# Patient Record
Sex: Female | Born: 1985
Health system: Southern US, Community
[De-identification: ages and names within clinical notes are randomized; demographics above are authoritative.]

## PROBLEM LIST (undated history)

## (undated) ENCOUNTER — Inpatient Hospital Stay (HOSPITAL_COMMUNITY): Payer: Self-pay

## (undated) DIAGNOSIS — Z332 Encounter for elective termination of pregnancy: Secondary | ICD-10-CM

## (undated) DIAGNOSIS — F419 Anxiety disorder, unspecified: Secondary | ICD-10-CM

## (undated) DIAGNOSIS — R011 Cardiac murmur, unspecified: Secondary | ICD-10-CM

## (undated) HISTORY — PX: WISDOM TOOTH EXTRACTION: SHX21

---

## 2009-07-01 ENCOUNTER — Other Ambulatory Visit: Admission: RE | Admit: 2009-07-01 | Discharge: 2009-07-01 | Payer: Self-pay | Admitting: Obstetrics and Gynecology

## 2009-10-24 ENCOUNTER — Inpatient Hospital Stay (HOSPITAL_COMMUNITY): Admission: AD | Admit: 2009-10-24 | Discharge: 2009-10-27 | Payer: Self-pay | Admitting: Obstetrics and Gynecology

## 2009-10-25 ENCOUNTER — Encounter (INDEPENDENT_AMBULATORY_CARE_PROVIDER_SITE_OTHER): Payer: Self-pay | Admitting: Obstetrics and Gynecology

## 2009-12-12 ENCOUNTER — Emergency Department (HOSPITAL_COMMUNITY): Admission: EM | Admit: 2009-12-12 | Discharge: 2009-12-12 | Payer: Self-pay | Admitting: Emergency Medicine

## 2010-12-18 LAB — CBC
HCT: 29.6 % — ABNORMAL LOW (ref 36.0–46.0)
HCT: 32.8 % — ABNORMAL LOW (ref 36.0–46.0)
HCT: 34 % — ABNORMAL LOW (ref 36.0–46.0)
Hemoglobin: 9.8 g/dL — ABNORMAL LOW (ref 12.0–15.0)
MCHC: 33.6 g/dL (ref 30.0–36.0)
MCHC: 33.8 g/dL (ref 30.0–36.0)
MCV: 103 fL — ABNORMAL HIGH (ref 78.0–100.0)
MCV: 103.4 fL — ABNORMAL HIGH (ref 78.0–100.0)
Platelets: 283 10*3/uL (ref 150–400)
RBC: 2.84 MIL/uL — ABNORMAL LOW (ref 3.87–5.11)
RBC: 3.29 MIL/uL — ABNORMAL LOW (ref 3.87–5.11)
RDW: 12.4 % (ref 11.5–15.5)
RDW: 12.4 % (ref 11.5–15.5)
WBC: 14.6 10*3/uL — ABNORMAL HIGH (ref 4.0–10.5)

## 2010-12-18 LAB — URINALYSIS, DIPSTICK ONLY
Bilirubin Urine: NEGATIVE
Hgb urine dipstick: NEGATIVE
Specific Gravity, Urine: 1.025 (ref 1.005–1.030)
Urobilinogen, UA: 0.2 mg/dL (ref 0.0–1.0)
pH: 8 (ref 5.0–8.0)

## 2010-12-18 LAB — BASIC METABOLIC PANEL
CO2: 22 mEq/L (ref 19–32)
Calcium: 8.4 mg/dL (ref 8.4–10.5)
GFR calc Af Amer: >60 mL/min (ref >60)
GFR calc non Af Amer: >60 mL/min (ref >60)
Glucose, Bld: 167 mg/dL — ABNORMAL HIGH (ref 70–99)
Potassium: 4.1 mEq/L (ref 3.5–5.1)
Sodium: 134 mEq/L — ABNORMAL LOW (ref 135–145)

## 2010-12-18 LAB — COMPREHENSIVE METABOLIC PANEL
AST: 23 U/L (ref 0–37)
Albumin: 3 g/dL — ABNORMAL LOW (ref 3.5–5.2)
BUN: 6 mg/dL (ref 6–23)
Calcium: 8.3 mg/dL — ABNORMAL LOW (ref 8.4–10.5)
Creatinine, Ser: 0.68 mg/dL (ref 0.4–1.2)
GFR calc Af Amer: >60 mL/min (ref >60)
Total Protein: 6.7 g/dL (ref 6.0–8.3)

## 2010-12-18 LAB — DIFFERENTIAL
Basophils Relative: 1 % (ref 0–1)
Eosinophils Absolute: 0 10*3/uL (ref 0.0–0.7)
Eosinophils Relative: 0 % (ref 0–5)
Monocytes Absolute: 0.6 10*3/uL (ref 0.1–1.0)
Monocytes Relative: 6 % (ref 3–12)
Neutrophils Relative %: 78 % — ABNORMAL HIGH (ref 43–77)

## 2010-12-18 LAB — URIC ACID: Uric Acid, Serum: 3.9 mg/dL (ref 2.4–7.0)

## 2010-12-18 LAB — CULTURE, BETA STREP (GROUP B ONLY)

## 2011-08-13 ENCOUNTER — Emergency Department (HOSPITAL_COMMUNITY)
Admission: EM | Admit: 2011-08-13 | Discharge: 2011-08-14 | Disposition: A | Discharge status: Home / Self Care | Payer: Medicaid Other | Attending: Emergency Medicine | Admitting: Emergency Medicine

## 2011-08-13 ENCOUNTER — Encounter: Payer: Self-pay | Admitting: Emergency Medicine

## 2011-08-13 ENCOUNTER — Other Ambulatory Visit: Payer: Self-pay

## 2011-08-13 DIAGNOSIS — R0789 Other chest pain: Secondary | ICD-10-CM | POA: Insufficient documentation

## 2011-08-13 DIAGNOSIS — R000 Tachycardia, unspecified: Secondary | ICD-10-CM | POA: Insufficient documentation

## 2011-08-13 NOTE — ED Notes (Signed)
PT. REPORTS CHEST PAIN /PALPITATIONS ONSET THIS EVENING ,  NO SOB , NO NAUSEA ,  NO COUGH.

## 2011-08-14 ENCOUNTER — Encounter (HOSPITAL_COMMUNITY): Payer: Self-pay | Admitting: Emergency Medicine

## 2011-08-14 ENCOUNTER — Emergency Department (HOSPITAL_COMMUNITY): Payer: Medicaid Other

## 2011-08-14 NOTE — ED Provider Notes (Signed)
History     CSN: [id number] Arrival date & time: 08/13/2011 11:35 PM   First MD Initiated Contact with Patient 08/14/11 0250      Chief Complaint  Patient presents with  . Chest Pain   HPI  Hx provided by the pt.  Pt presents with complaints of chest pain and fast beating heart. Symptoms began in the evening and lasted over an hour.  Pt reports similar episodes when she was 25yo and 25yo.  Pt does admit to increased stress at work and "life".  She denies feeling any anxiety at this time.  Pt also reports symptoms have completely resolved.  Pt has no recent illness.  No fever, chills, sweats.  Pt denies SOB at this time. No pleuritic pain or hemoptysis.   History reviewed. No pertinent past medical history.  History reviewed. No pertinent past surgical history.  History reviewed. No pertinent family history.  History  Substance Use Topics  . Smoking status: Never Smoker   . Smokeless tobacco: Not on file  . Alcohol Use: No    OB History    Grav Para Term Preterm Abortions TAB SAB Ect Mult Living                  Review of Systems  Constitutional: Negative for fever and chills.  HENT: Negative for congestion and rhinorrhea.   Respiratory: Negative for cough.   Cardiovascular: Positive for chest pain and palpitations.  Gastrointestinal: Negative for nausea, vomiting, diarrhea and constipation.  All other systems reviewed and are negative.    Allergies  Penicillins cross reactors  Home Medications  No current outpatient prescriptions on file.  BP 117/64  Pulse 81  Temp(Src) 98.8 F (37.1 C) (Oral)  Resp 16  SpO2 100%  Physical Exam  Nursing note and vitals reviewed. Constitutional: She is oriented to person, place, and time. She appears well-developed and well-nourished. No distress.  HENT:  Head: Normocephalic.  Cardiovascular: Normal rate and regular rhythm.  Exam reveals no gallop and no friction rub.   No murmur heard. Pulmonary/Chest: Effort normal.  She has no wheezes. She has no rales.  Abdominal: Soft. Bowel sounds are normal. She exhibits no mass. There is no tenderness. There is no rebound and no guarding.  Musculoskeletal: She exhibits no edema and no tenderness.  Neurological: She is alert and oriented to person, place, and time.  Skin: Skin is warm. No rash noted.  Psychiatric: She has a normal mood and affect.    ED Course  Procedures (including critical care time)  Labs Reviewed - No data to display Dg Chest 2 View  08/14/2011  *RADIOLOGY REPORT*  Clinical Data: Chest pain and palpitations.  CHEST - 2 VIEW  Comparison: None.  Findings: The lungs are well-aerated and clear.  There is no evidence of focal opacification, pleural effusion or pneumothorax.  The heart is normal in size; the mediastinal contour is within normal limits.  No acute osseous abnormalities are seen.  IMPRESSION: No acute cardiopulmonary process seen.  Original Report Authenticated By: Tonia Ghent, M.D.     1. Chest pain       MDM   Pt feels fine now. Pt in no acute distress.  Pt with normal respirations, O2 sats, ECG, and CXR. Will d/c home at this time.  ECG  Date: 08/14/2011  Rate: 82  Rhythm: normal sinus rhythm  QRS Axis: normal  Intervals: normal  ST/T Wave abnormalities: normal  Conduction Disutrbances:none  Narrative Interpretation:   Old EKG  Reviewed: none available          Angus Seller, [place] 08/14/11 [phone]

## 2011-08-14 NOTE — ED Provider Notes (Signed)
Medical screening examination/treatment/procedure(s) were performed by non-physician practitioner and as supervising physician I was immediately available for consultation/collaboration.   Olivia Mackie, MD 08/14/11 [phone]

## 2011-08-14 NOTE — ED Notes (Signed)
PA Peter in with pt.

## 2011-08-14 NOTE — ED Notes (Signed)
Pt to ED c/o rapid hr starting at 17:30 this pm that increased to pain.  Presently pt showing 76 nsr on monitor and she is denying any pain.  Pt she has had 2 other experiences like this, at ages 54 and 89 and was told she had anxiety.

## 2011-10-02 ENCOUNTER — Encounter (HOSPITAL_COMMUNITY): Payer: Self-pay | Admitting: *Deleted

## 2011-10-02 ENCOUNTER — Emergency Department (HOSPITAL_COMMUNITY)
Admission: EM | Admit: 2011-10-02 | Discharge: 2011-10-02 | Disposition: A | Discharge status: Home / Self Care | Payer: Medicaid Other | Attending: Emergency Medicine | Admitting: Emergency Medicine

## 2011-10-02 DIAGNOSIS — R05 Cough: Secondary | ICD-10-CM | POA: Insufficient documentation

## 2011-10-02 DIAGNOSIS — B349 Viral infection, unspecified: Secondary | ICD-10-CM

## 2011-10-02 DIAGNOSIS — R070 Pain in throat: Secondary | ICD-10-CM | POA: Insufficient documentation

## 2011-10-02 DIAGNOSIS — R6883 Chills (without fever): Secondary | ICD-10-CM | POA: Insufficient documentation

## 2011-10-02 DIAGNOSIS — B9789 Other viral agents as the cause of diseases classified elsewhere: Secondary | ICD-10-CM | POA: Insufficient documentation

## 2011-10-02 DIAGNOSIS — R5383 Other fatigue: Secondary | ICD-10-CM | POA: Insufficient documentation

## 2011-10-02 DIAGNOSIS — R220 Localized swelling, mass and lump, head: Secondary | ICD-10-CM | POA: Insufficient documentation

## 2011-10-02 DIAGNOSIS — R1310 Dysphagia, unspecified: Secondary | ICD-10-CM | POA: Insufficient documentation

## 2011-10-02 DIAGNOSIS — R5381 Other malaise: Secondary | ICD-10-CM | POA: Insufficient documentation

## 2011-10-02 DIAGNOSIS — J3489 Other specified disorders of nose and nasal sinuses: Secondary | ICD-10-CM | POA: Insufficient documentation

## 2011-10-02 DIAGNOSIS — R059 Cough, unspecified: Secondary | ICD-10-CM | POA: Insufficient documentation

## 2011-10-02 DIAGNOSIS — R6889 Other general symptoms and signs: Secondary | ICD-10-CM | POA: Insufficient documentation

## 2011-10-02 NOTE — ED Provider Notes (Signed)
History     CSN: [id number]  Arrival date & time 10/02/11  1925   First MD Initiated Contact with Patient 10/02/11 2123      Chief Complaint  Patient presents with  . Sore Throat    (Consider location/radiation/quality/duration/timing/severity/associated sxs/prior treatment) Patient is a 26 y.o. female presenting with pharyngitis. The history is provided by the patient.  Sore Throat This is a new problem. The current episode started in the past 7 days. The problem occurs constantly. The problem has been unchanged. Associated symptoms include chills, congestion, coughing, fatigue and a sore throat. Pertinent negatives include no abdominal pain, change in bowel habit, chest pain, fever, headaches, myalgias, nausea, neck pain, numbness, rash, urinary symptoms, vertigo, visual change, vomiting or weakness. The symptoms are aggravated by nothing. She has tried nothing for the symptoms.  Sore Throat This is a new problem. The current episode started in the past 7 days. The problem occurs constantly. The problem has been unchanged. Pertinent negatives include no chest pain, no abdominal pain, no headaches and no shortness of breath. The symptoms are aggravated by nothing. She has tried nothing for the symptoms.    History reviewed. No pertinent past medical history.  History reviewed. No pertinent past surgical history.  History reviewed. No pertinent family history.  History  Substance Use Topics  . Smoking status: Never Smoker   . Smokeless tobacco: Not on file  . Alcohol Use: No     Review of Systems  Constitutional: Positive for chills and fatigue. Negative for fever.  HENT: Positive for ear pain, congestion, sore throat, rhinorrhea, sneezing and trouble swallowing. Negative for nosebleeds, drooling, neck pain, neck stiffness and tinnitus.   Eyes: Negative for pain and visual disturbance.  Respiratory: Positive for cough. Negative for chest tightness and shortness of breath.     Cardiovascular: Negative for chest pain and leg swelling.  Gastrointestinal: Negative for nausea, vomiting, abdominal pain and change in bowel habit.  Genitourinary: Negative for dysuria, hematuria and flank pain.  Musculoskeletal: Negative for myalgias, back pain and gait problem.  Skin: Negative for rash and wound.  Neurological: Negative for vertigo, syncope, weakness, numbness and headaches.  Psychiatric/Behavioral: Negative for confusion.    Allergies  Penicillins cross reactors  Home Medications  No current outpatient prescriptions on file.  BP 108/67  Pulse 108  Temp(Src) 98.7 F (37.1 C) (Oral)  Resp 20  SpO2 100%  LMP 10/02/2011  Physical Exam  Nursing note and vitals reviewed. Constitutional: She is oriented to person, place, and time. She appears well-developed and well-nourished. No distress.  HENT:  Head: Normocephalic and atraumatic.  Right Ear: Hearing, tympanic membrane, external ear and ear canal normal.  Left Ear: Hearing, tympanic membrane, external ear and ear canal normal.  Nose: Rhinorrhea present. Right sinus exhibits no maxillary sinus tenderness. Left sinus exhibits no maxillary sinus tenderness.  Mouth/Throat: Uvula is midline and mucous membranes are normal. No uvula swelling. Posterior oropharyngeal edema and posterior oropharyngeal erythema present. No oropharyngeal exudate or tonsillar abscesses.  Eyes: Conjunctivae and EOM are normal. Pupils are equal, round, and reactive to light.  Neck: Normal range of motion. Neck supple.  Cardiovascular: Normal rate, regular rhythm, normal heart sounds and intact distal pulses.   No murmur heard.      Palpated HR on my exam is 96bpm  Pulmonary/Chest: Effort normal and breath sounds normal. No respiratory distress. She has no wheezes. She exhibits no tenderness.  Abdominal: Soft. Bowel sounds are normal. She exhibits no distension. There  is no tenderness.  Musculoskeletal: She exhibits no edema and no  tenderness.  Lymphadenopathy:    She has no cervical adenopathy.  Neurological: She is alert and oriented to person, place, and time. No cranial nerve deficit.  Skin: Skin is warm and dry. No rash noted.    ED Course  Procedures (including critical care time)   Labs Reviewed  RAPID STREP SCREEN   No results found.   Dx 1: Viral Illness    MDM  Viral illness symptoms x3 days. Given there is no fever no myalgias, do not suspect influenza. The patient had a negative strep screen. She also has negative centor criteria-positive cough, negative exudate, negative cervical lymphadenopathy, negative fever. I discussed treatment for viruses with her and the symptomatic treatment options that are recommended   Medical screening examination/treatment/procedure(s) were performed by non-physician practitioner and as supervising physician I was immediately available for consultation/collaboration. Osvaldo Human, M.D.      [address] [place], [place] 10/02/11 2224  Carleene Cooper III, MD 10/03/11 [phone]

## 2011-10-02 NOTE — ED Notes (Signed)
Pt not in room at time of d/c. Presumed that pt has left dept.

## 2011-10-02 NOTE — ED Notes (Signed)
States sore throat with chest congestion and pain x 3 days. Pt ambulatory in triage speaking in complete sentences. Appears in no resp distress, cough not audible.

## 2011-10-02 NOTE — ED Notes (Signed)
sorethroat for 3 days difficulty swallowing.  Maybe some temp

## 2012-04-01 ENCOUNTER — Encounter (HOSPITAL_COMMUNITY): Payer: Self-pay | Admitting: Emergency Medicine

## 2012-04-01 ENCOUNTER — Emergency Department (HOSPITAL_COMMUNITY)
Admission: EM | Admit: 2012-04-01 | Discharge: 2012-04-01 | Disposition: A | Discharge status: Home / Self Care | Payer: Medicaid Other | Attending: Emergency Medicine | Admitting: Emergency Medicine

## 2012-04-01 DIAGNOSIS — R197 Diarrhea, unspecified: Secondary | ICD-10-CM

## 2012-04-01 DIAGNOSIS — R110 Nausea: Secondary | ICD-10-CM | POA: Insufficient documentation

## 2012-04-01 LAB — URINALYSIS, ROUTINE W REFLEX MICROSCOPIC
Bilirubin Urine: NEGATIVE
Leukocytes, UA: NEGATIVE
Nitrite: NEGATIVE
Specific Gravity, Urine: 1.014 (ref 1.005–1.030)
Urobilinogen, UA: 0.2 mg/dL (ref 0.0–1.0)

## 2012-04-01 LAB — CBC WITH DIFFERENTIAL/PLATELET
Eosinophils Absolute: 0.2 10*3/uL (ref 0.0–0.7)
Eosinophils Relative: 4 % (ref 0–5)
HCT: 38.9 % (ref 36.0–46.0)
Lymphocytes Relative: 44 % (ref 12–46)
Lymphs Abs: 1.9 10*3/uL (ref 0.7–4.0)
MCH: 32.6 pg (ref 26.0–34.0)
MCV: 95.3 fL (ref 78.0–100.0)
Monocytes Absolute: 0.2 10*3/uL (ref 0.1–1.0)
Monocytes Relative: 5 % (ref 3–12)
RBC: 4.08 MIL/uL (ref 3.87–5.11)
WBC: 4.2 10*3/uL (ref 4.0–10.5)

## 2012-04-01 LAB — COMPREHENSIVE METABOLIC PANEL
ALT: 8 U/L (ref 0–35)
BUN: 15 mg/dL (ref 6–23)
CO2: 25 mEq/L (ref 19–32)
Calcium: 9.7 mg/dL (ref 8.4–10.5)
Creatinine, Ser: 0.81 mg/dL (ref 0.50–1.10)
GFR calc Af Amer: >90 mL/min (ref >90)
GFR calc non Af Amer: >90 mL/min (ref >90)
Glucose, Bld: 122 mg/dL — ABNORMAL HIGH (ref 70–99)
Total Protein: 7.4 g/dL (ref 6.0–8.3)

## 2012-04-01 MED ORDER — ONDANSETRON HCL 4 MG PO TABS: 4.0000 mg | ORAL_TABLET | Freq: Four times a day (QID) | ORAL | Status: AC

## 2012-04-01 NOTE — ED Provider Notes (Signed)
History     CSN: [id number]  Arrival date & time 04/01/12  1136   First MD Initiated Contact with Patient 04/01/12 1233      Chief Complaint  Patient presents with  . Nausea  . Diarrhea    bloody    (Consider location/radiation/quality/duration/timing/severity/associated sxs/prior treatment) HPI  Patient presented to the emergency department with onset of nausea and diarrhea on Sunday. She states that she had a couple episodes on Monday but this morning she had one episode of diarrhea and there was some mucus with a very small amount of blood mixed in with it. 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 She said that the symptoms onset after eating [place] food on Sunday evening. She denies any abdominal pain but does endorse having some mild cramping before the diarrhea starts. The reason she came to the ER is because of the flecks of blood she saw in the diarrhea this morning. She was concerned that she has never had blood in her bowel movement before. She denies having any fevers, chills or weakness. The patient is in no acute distress and her vital signs are stable.  History reviewed. No pertinent past medical history.  History reviewed. No pertinent past surgical history.  History reviewed. No pertinent family history.  History  Substance Use Topics  . Smoking status: Current Everyday Smoker  . Smokeless tobacco: Not on file  . Alcohol Use: No    OB History    Grav Para Term Preterm Abortions TAB SAB Ect Mult Living                  Review of Systems   HEENT: denies blurry vision or change in hearing PULMONARY: Denies difficulty breathing and SOB CARDIAC: denies chest pain or heart palpitations MUSCULOSKELETAL:  denies being unable to ambulate ABDOMEN AL: denies abdominal pain GU: denies loss of bowel or urinary control NEURO: denies numbness and tingling in extremities SKIN: no new rashes PSYCH: patient denies anxiety  or depression. NECK: Pt denies having neck pain     Allergies  Penicillins cross reactors  Home Medications   Current Outpatient Rx  Name Route Sig Dispense Refill  . ACETAMINOPHEN 325 MG PO TABS Oral Take 650 mg by mouth every 6 (six) hours as needed. For pain    . NAPROXEN 250 MG PO TABS Oral Take 250 mg by mouth daily as needed. For pain    . NYSTATIN-TRIAMCINOLONE 100000-0.1 UNIT/GM-% EX CREA Topical Apply 1 application topically 2 (two) times daily.    Marland Kitchen ONDANSETRON HCL 4 MG PO TABS Oral Take 1 tablet (4 mg total) by mouth every 6 (six) hours. 12 tablet 0    BP 117/76  Pulse 99  Temp 98.8 F (37.1 C) (Oral)  Resp 20  SpO2 99%  LMP 03/02/2012  Physical Exam  Nursing note and vitals reviewed. Constitutional: She appears well-developed and well-nourished. No distress.  HENT:  Head: Normocephalic and atraumatic.  Eyes: Pupils are equal, round, and reactive to light.  Neck: Normal range of motion. Neck supple.  Cardiovascular: Normal rate and regular rhythm.   Pulmonary/Chest: Effort normal.  Abdominal: Soft. She exhibits no distension. There is no tenderness. There is no rebound.  Neurological: She is alert.  Skin: Skin is warm and dry.    ED Course  Procedures (including critical care time)  Labs Reviewed  CBC WITH DIFFERENTIAL - Abnormal; Notable for the following:    RDW  11.4 (*)     All other components within normal limits  COMPREHENSIVE METABOLIC PANEL - Abnormal; Notable for the following:    Glucose, Bld 122 (*)     Total Bilirubin 0.2 (*)     All other components within normal limits  URINALYSIS, ROUTINE W REFLEX MICROSCOPIC  PREGNANCY, URINE   No results found.   1. Diarrhea       MDM  Patient exam and laboratory workup benign. Patient was mainly concerned because of some mucousy bloody stool times one episode. I'm giving her a referral to GI. I think she most likely has upset stomach due to food. Patient not having any abdominal pain.  Abdomen reevaluated prior to discharge she continues to be painless. I've given a prescription for Zofran indication develops nausea. I've recommended that she does not take an antidiarrheal medication so that if her body is trying to get rid of infection it has not hindered.  Pt has been advised of the symptoms that warrant their return to the ED. Patient has voiced understanding and has agreed to follow-up with the PCP or specialist.         Dorthula Matas, PA 04/01/12 1418

## 2012-04-01 NOTE — ED Notes (Signed)
Pt reports onset Sunday night with nausea and bloody diarrhea this morning. 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 Symptoms onset after eating chinese food. Pt denies abdominal pain.

## 2012-04-01 NOTE — ED Notes (Signed)
Pt. Reports nausea starting Sunday, denies vomitting. States that she was in contact with a child who had worms, but did not change diaper or have contact with feces. Reports 3 episodes of bloody diarrhea starting this AM. Denies N/V currently. Abdomen soft and non-tender

## 2012-04-01 NOTE — ED Notes (Signed)
Prescription given with discharge instructions.  

## 2012-04-02 NOTE — ED Provider Notes (Signed)
Medical screening examination/treatment/procedure(s) were performed by non-physician practitioner and as supervising physician I was immediately available for consultation/collaboration.  Cyndra Numbers, MD 04/02/12 1324

## 2013-06-09 ENCOUNTER — Other Ambulatory Visit (HOSPITAL_COMMUNITY)
Admission: RE | Admit: 2013-06-09 | Discharge: 2013-06-09 | Disposition: A | Discharge status: Home / Self Care | Payer: 59 | Source: Ambulatory Visit | Attending: Family Medicine | Admitting: Family Medicine

## 2013-06-09 ENCOUNTER — Other Ambulatory Visit: Payer: Self-pay | Admitting: Family Medicine

## 2013-06-09 DIAGNOSIS — Z124 Encounter for screening for malignant neoplasm of cervix: Secondary | ICD-10-CM | POA: Insufficient documentation

## 2013-06-09 DIAGNOSIS — Z113 Encounter for screening for infections with a predominantly sexual mode of transmission: Secondary | ICD-10-CM | POA: Insufficient documentation

## 2013-06-09 DIAGNOSIS — Z1151 Encounter for screening for human papillomavirus (HPV): Secondary | ICD-10-CM | POA: Insufficient documentation

## 2013-06-09 DIAGNOSIS — R87810 Cervical high risk human papillomavirus (HPV) DNA test positive: Secondary | ICD-10-CM | POA: Insufficient documentation

## 2013-08-02 ENCOUNTER — Emergency Department (HOSPITAL_COMMUNITY)
Admission: EM | Admit: 2013-08-02 | Discharge: 2013-08-02 | Disposition: A | Discharge status: Home / Self Care | Payer: 59 | Source: Home / Self Care

## 2013-09-07 ENCOUNTER — Other Ambulatory Visit: Payer: Self-pay | Admitting: Family Medicine

## 2013-09-07 ENCOUNTER — Ambulatory Visit
Admission: RE | Admit: 2013-09-07 | Discharge: 2013-09-07 | Disposition: A | Discharge status: Home / Self Care | Payer: 59 | Source: Ambulatory Visit | Attending: Family Medicine | Admitting: Family Medicine

## 2013-09-07 DIAGNOSIS — R200 Anesthesia of skin: Secondary | ICD-10-CM

## 2013-11-04 ENCOUNTER — Other Ambulatory Visit: Payer: Self-pay | Admitting: Obstetrics and Gynecology

## 2015-12-12 ENCOUNTER — Other Ambulatory Visit: Payer: Self-pay | Admitting: Family Medicine

## 2015-12-12 ENCOUNTER — Other Ambulatory Visit (HOSPITAL_COMMUNITY)
Admission: RE | Admit: 2015-12-12 | Discharge: 2015-12-12 | Disposition: A | Discharge status: Home / Self Care | Payer: BLUE CROSS/BLUE SHIELD | Source: Ambulatory Visit | Attending: Family Medicine | Admitting: Family Medicine

## 2015-12-12 DIAGNOSIS — Z01411 Encounter for gynecological examination (general) (routine) with abnormal findings: Secondary | ICD-10-CM | POA: Diagnosis present

## 2015-12-12 DIAGNOSIS — Z1151 Encounter for screening for human papillomavirus (HPV): Secondary | ICD-10-CM | POA: Insufficient documentation

## 2015-12-15 LAB — CYTOLOGY - PAP

## 2016-11-29 DIAGNOSIS — A5901 Trichomonal vulvovaginitis: Secondary | ICD-10-CM | POA: Diagnosis not present

## 2016-11-29 DIAGNOSIS — F17210 Nicotine dependence, cigarettes, uncomplicated: Secondary | ICD-10-CM | POA: Diagnosis not present

## 2016-11-29 DIAGNOSIS — R87610 Atypical squamous cells of undetermined significance on cytologic smear of cervix (ASC-US): Secondary | ICD-10-CM | POA: Diagnosis not present

## 2016-11-29 DIAGNOSIS — B373 Candidiasis of vulva and vagina: Secondary | ICD-10-CM | POA: Diagnosis not present

## 2016-11-29 DIAGNOSIS — Z3481 Encounter for supervision of other normal pregnancy, first trimester: Secondary | ICD-10-CM | POA: Diagnosis not present

## 2016-11-29 DIAGNOSIS — Z8751 Personal history of pre-term labor: Secondary | ICD-10-CM | POA: Diagnosis not present

## 2016-11-29 LAB — OB RESULTS CONSOLE RUBELLA ANTIBODY, IGM: Rubella: IMMUNE

## 2016-11-29 LAB — OB RESULTS CONSOLE HGB/HCT, BLOOD
HEMATOCRIT: 37 %
HEMOGLOBIN: 12.3 g/dL

## 2016-11-29 LAB — OB RESULTS CONSOLE ABO/RH: RH Type: POSITIVE

## 2016-11-29 LAB — OB RESULTS CONSOLE RPR: RPR: NONREACTIVE

## 2016-11-29 LAB — CYSTIC FIBROSIS DIAGNOSTIC STUDY: Interpretation-CFDNA:: NEGATIVE

## 2016-11-29 LAB — GLUCOSE TOLERANCE, 1 HOUR: Glucose, 1 Hour GTT: 78

## 2016-11-29 LAB — OB RESULTS CONSOLE PLATELET COUNT: Platelets: 389 10*3/uL

## 2016-11-29 LAB — OB RESULTS CONSOLE GC/CHLAMYDIA
Chlamydia: NEGATIVE
GC PROBE AMP, GENITAL: NEGATIVE

## 2016-11-29 LAB — OB RESULTS CONSOLE HEPATITIS B SURFACE ANTIGEN: HEP B S AG: NEGATIVE

## 2016-11-29 LAB — OB RESULTS CONSOLE VARICELLA ZOSTER ANTIBODY, IGG: Varicella: IMMUNE

## 2016-11-29 LAB — OB RESULTS CONSOLE ANTIBODY SCREEN: ANTIBODY SCREEN: NEGATIVE

## 2016-11-29 LAB — CYTOLOGY - PAP: PAP SMEAR: ABNORMAL — AB

## 2016-12-03 ENCOUNTER — Other Ambulatory Visit (HOSPITAL_COMMUNITY): Payer: Self-pay | Admitting: Nurse Practitioner

## 2016-12-03 DIAGNOSIS — Z3682 Encounter for antenatal screening for nuchal translucency: Secondary | ICD-10-CM

## 2016-12-03 DIAGNOSIS — Z3A13 13 weeks gestation of pregnancy: Secondary | ICD-10-CM

## 2016-12-05 ENCOUNTER — Encounter (HOSPITAL_COMMUNITY): Payer: Self-pay | Admitting: *Deleted

## 2016-12-06 ENCOUNTER — Ambulatory Visit (HOSPITAL_COMMUNITY)
Admission: RE | Admit: 2016-12-06 | Discharge: 2016-12-06 | Disposition: A | Discharge status: Home / Self Care | Payer: BLUE CROSS/BLUE SHIELD | Source: Ambulatory Visit | Attending: Nurse Practitioner | Admitting: Nurse Practitioner

## 2016-12-06 ENCOUNTER — Encounter (HOSPITAL_COMMUNITY): Payer: Self-pay

## 2016-12-06 DIAGNOSIS — Z3682 Encounter for antenatal screening for nuchal translucency: Secondary | ICD-10-CM | POA: Diagnosis not present

## 2016-12-06 DIAGNOSIS — Z3A13 13 weeks gestation of pregnancy: Secondary | ICD-10-CM | POA: Insufficient documentation

## 2016-12-06 DIAGNOSIS — Z36 Encounter for antenatal screening for chromosomal anomalies: Secondary | ICD-10-CM | POA: Diagnosis not present

## 2016-12-06 DIAGNOSIS — O09211 Supervision of pregnancy with history of pre-term labor, first trimester: Secondary | ICD-10-CM | POA: Insufficient documentation

## 2016-12-06 HISTORY — DX: Cardiac murmur, unspecified: R01.1

## 2016-12-10 ENCOUNTER — Other Ambulatory Visit: Payer: Self-pay

## 2016-12-12 ENCOUNTER — Encounter: Payer: Self-pay | Admitting: Family Medicine

## 2016-12-12 ENCOUNTER — Ambulatory Visit (INDEPENDENT_AMBULATORY_CARE_PROVIDER_SITE_OTHER): Payer: BLUE CROSS/BLUE SHIELD | Admitting: Obstetrics & Gynecology

## 2016-12-12 ENCOUNTER — Encounter: Payer: Self-pay | Admitting: Obstetrics & Gynecology

## 2016-12-12 VITALS — BP 113/72 | HR 88 | Ht 62.0 in | Wt 124.1 lb

## 2016-12-12 DIAGNOSIS — O99340 Other mental disorders complicating pregnancy, unspecified trimester: Secondary | ICD-10-CM

## 2016-12-12 DIAGNOSIS — O99342 Other mental disorders complicating pregnancy, second trimester: Secondary | ICD-10-CM

## 2016-12-12 DIAGNOSIS — O99331 Smoking (tobacco) complicating pregnancy, first trimester: Secondary | ICD-10-CM | POA: Insufficient documentation

## 2016-12-12 DIAGNOSIS — O99332 Smoking (tobacco) complicating pregnancy, second trimester: Secondary | ICD-10-CM

## 2016-12-12 DIAGNOSIS — F419 Anxiety disorder, unspecified: Secondary | ICD-10-CM | POA: Insufficient documentation

## 2016-12-12 DIAGNOSIS — O09899 Supervision of other high risk pregnancies, unspecified trimester: Secondary | ICD-10-CM | POA: Insufficient documentation

## 2016-12-12 DIAGNOSIS — Z3689 Encounter for other specified antenatal screening: Secondary | ICD-10-CM

## 2016-12-12 DIAGNOSIS — O0992 Supervision of high risk pregnancy, unspecified, second trimester: Secondary | ICD-10-CM

## 2016-12-12 DIAGNOSIS — Z23 Encounter for immunization: Secondary | ICD-10-CM

## 2016-12-12 DIAGNOSIS — O09212 Supervision of pregnancy with history of pre-term labor, second trimester: Secondary | ICD-10-CM

## 2016-12-12 DIAGNOSIS — O09219 Supervision of pregnancy with history of pre-term labor, unspecified trimester: Principal | ICD-10-CM

## 2016-12-12 DIAGNOSIS — O0990 Supervision of high risk pregnancy, unspecified, unspecified trimester: Secondary | ICD-10-CM | POA: Insufficient documentation

## 2016-12-12 LAB — POCT URINALYSIS DIP (DEVICE)
Bilirubin Urine: NEGATIVE
GLUCOSE, UA: NEGATIVE mg/dL
Hgb urine dipstick: NEGATIVE
KETONES UR: NEGATIVE mg/dL
NITRITE: NEGATIVE
PH: 7 (ref 5.0–8.0)
PROTEIN: NEGATIVE mg/dL
Specific Gravity, Urine: 1.02 (ref 1.005–1.030)
UROBILINOGEN UA: 0.2 mg/dL (ref 0.0–1.0)

## 2016-12-12 MED ORDER — NICOTINE 7 MG/24HR TD PT24: 7.0000 mg | MEDICATED_PATCH | Freq: Every day | TRANSDERMAL | 12 refills | Status: DC

## 2016-12-12 MED ORDER — HYDROXYZINE PAMOATE 50 MG PO CAPS: 50.0000 mg | ORAL_CAPSULE | Freq: Three times a day (TID) | ORAL | 3 refills | Status: DC | PRN

## 2016-12-12 NOTE — Patient Instructions (Signed)
Thank you for enrolling in MyChart. Please follow the instructions below to securely access your online medical record. MyChart allows you to send messages to your doctor, view your test results, manage appointments, and more.   How Do I Sign Up? 1. In your Internet browser, go to [employer] and enter [email or web address]. 2. Click on the Sign Up Now link in the Sign In box. You will see the New Member Sign Up page. 3. Enter your MyChart Access Code exactly as it appears below. You will not need to use this code after you've completed the sign-up process. If you do not sign up before the expiration date, you must request a new code.  MyChart Access Code: [email or web address] Expires: 02/10/2017  8:47 AM  4. Enter your Social Security Number ([email or web address]) and Date of Birth (mm/dd/yyyy) as indicated and click Submit. You will be taken to the next sign-up page. 5. Create a MyChart ID. This will be your MyChart login ID and cannot be changed, so think of one that is secure and easy to remember. 6. Create a MyChart password. You can change your password at any time. 7. Enter your Password Reset Question and Answer. This can be used at a later time if you forget your password.  8. Enter your e-mail address. You will receive e-mail notification when new information is available in MyChart. 9. Click Sign Up. You can now view your medical record.   Additional Information Remember, MyChart is NOT to be used for urgent needs. For medical emergencies, dial 911.  Second Trimester of Pregnancy The second trimester is from week 14 through week 27 (months 4 through 6). The second trimester is often a time when you feel your best. Your body has adjusted to being pregnant, and you begin to feel better physically. Usually, morning sickness has lessened or quit completely, you may have more energy, and you may have an increase in appetite. The second trimester is also a time when the fetus is growing  rapidly. At the end of the sixth month, the fetus is about 9 inches long and weighs about 1 pounds. You will likely begin to feel the baby move (quickening) between 16 and 20 weeks of pregnancy. Body changes during your second trimester Your body continues to go through many changes during your second trimester. The changes vary from woman to woman.  Your weight will continue to increase. You will notice your lower abdomen bulging out.  You may begin to get stretch marks on your hips, abdomen, and breasts.  You may develop headaches that can be relieved by medicines. The medicines should be approved by your health care provider.  You may urinate more often because the fetus is pressing on your bladder.  You may develop or continue to have heartburn as a result of your pregnancy.  You may develop constipation because certain hormones are causing the muscles that push waste through your intestines to slow down.  You may develop hemorrhoids or swollen, bulging veins (varicose veins).  You may have back pain. This is caused by:  Weight gain.  Pregnancy hormones that are relaxing the joints in your pelvis.  A shift in weight and the muscles that support your balance.  Your breasts will continue to grow and they will continue to become tender.  Your gums may bleed and may be sensitive to brushing and flossing.  Dark spots or blotches (chloasma, mask of pregnancy) may develop on your face. This will likely fade after the baby  is born.  A dark line from your belly button to the pubic area (linea nigra) may appear. This will likely fade after the baby is born.  You may have changes in your hair. These can include thickening of your hair, rapid growth, and changes in texture. Some women also have hair loss during or after pregnancy, or hair that feels dry or thin. Your hair will most likely return to normal after your baby is born. What to expect at prenatal visits During a routine  prenatal visit:  You will be weighed to make sure you and the fetus are growing normally.  Your blood pressure will be taken.  Your abdomen will be measured to track your baby's growth.  The fetal heartbeat will be listened to.  Any test results from the previous visit will be discussed. Your health care provider may ask you:  How you are feeling.  If you are feeling the baby move.  If you have had any abnormal symptoms, such as leaking fluid, bleeding, severe headaches, or abdominal cramping.  If you are using any tobacco products, including cigarettes, chewing tobacco, and electronic cigarettes.  If you have any questions. Other tests that may be performed during your second trimester include:  Blood tests that check for:  Low iron levels (anemia).  High blood sugar that affects pregnant women (gestational diabetes) between 4024 and 28 weeks.  Rh antibodies. This is to check for a protein on red blood cells (Rh factor).  Urine tests to check for infections, diabetes, or protein in the urine.  An ultrasound to confirm the proper growth and development of the baby.  An amniocentesis to check for possible genetic problems.  Fetal screens for spina bifida and Down syndrome.  HIV (human immunodeficiency virus) testing. Routine prenatal testing includes screening for HIV, unless you choose not to have this test. Follow these instructions at home: Medicines   Follow your health care provider's instructions regarding medicine use. Specific medicines may be either safe or unsafe to take during pregnancy.  Take a prenatal vitamin that contains at least 600 micrograms (mcg) of folic acid.  If you develop constipation, try taking a stool softener if your health care provider approves. Eating and drinking   Eat a balanced diet that includes fresh fruits and vegetables, whole grains, good sources of protein such as meat, eggs, or tofu, and low-fat dairy. Your health care provider  will help you determine the amount of weight gain that is right for you.  Avoid raw meat and uncooked cheese. These carry germs that can cause birth defects in the baby.  If you have low calcium intake from food, talk to your health care provider about whether you should take a daily calcium supplement.  Limit foods that are high in fat and processed sugars, such as fried and sweet foods.  To prevent constipation:  Drink enough fluid to keep your urine clear or pale yellow.  Eat foods that are high in fiber, such as fresh fruits and vegetables, whole grains, and beans. Activity   Exercise only as directed by your health care provider. Most women can continue their usual exercise routine during pregnancy. Try to exercise for 30 minutes at least 5 days a week. Stop exercising if you experience uterine contractions.  Avoid heavy lifting, wear low heel shoes, and practice good posture.  A sexual relationship may be continued unless your health care provider directs you otherwise. Relieving pain and discomfort   Wear a good support bra  to prevent discomfort from breast tenderness.  Take warm sitz baths to soothe any pain or discomfort caused by hemorrhoids. Use hemorrhoid cream if your health care provider approves.  Rest with your legs elevated if you have leg cramps or low back pain.  If you develop varicose veins, wear support hose. Elevate your feet for 15 minutes, 3-4 times a day. Limit salt in your diet. Prenatal Care   Write down your questions. Take them to your prenatal visits.  Keep all your prenatal visits as told by your health care provider. This is important. Safety   Wear your seat belt at all times when driving.  Make a list of emergency phone numbers, including numbers for family, friends, the hospital, and police and fire departments. General instructions   Ask your health care provider for a referral to a local prenatal education class. Begin classes no later  than the beginning of month 6 of your pregnancy.  Ask for help if you have counseling or nutritional needs during pregnancy. Your health care provider can offer advice or refer you to specialists for help with various needs.  Do not use hot tubs, steam rooms, or saunas.  Do not douche or use tampons or scented sanitary pads.  Do not cross your legs for long periods of time.  Avoid cat litter boxes and soil used by cats. These carry germs that can cause birth defects in the baby and possibly loss of the fetus by miscarriage or stillbirth.  Avoid all smoking, herbs, alcohol, and unprescribed drugs. Chemicals in these products can affect the formation and growth of the baby.  Do not use any products that contain nicotine or tobacco, such as cigarettes and e-cigarettes. If you need help quitting, ask your health care provider.  Visit your dentist if you have not gone yet during your pregnancy. Use a soft toothbrush to brush your teeth and be gentle when you floss. Contact a health care provider if:  You have dizziness.  You have mild pelvic cramps, pelvic pressure, or nagging pain in the abdominal area.  You have persistent nausea, vomiting, or diarrhea.  You have a bad smelling vaginal discharge.  You have pain when you urinate. Get help right away if:  You have a fever.  You are leaking fluid from your vagina.  You have spotting or bleeding from your vagina.  You have severe abdominal cramping or pain.  You have rapid weight gain or weight loss.  You have shortness of breath with chest pain.  You notice sudden or extreme swelling of your face, hands, ankles, feet, or legs.  You have not felt your baby move in over an hour.  You have severe headaches that do not go away when you take medicine.  You have vision changes. Summary  The second trimester is from week 14 through week 27 (months 4 through 6). It is also a time when the fetus is growing rapidly.  Your body  goes through many changes during pregnancy. The changes vary from woman to woman.  Avoid all smoking, herbs, alcohol, and unprescribed drugs. These chemicals affect the formation and growth your baby.  Do not use any tobacco products, such as cigarettes, chewing tobacco, and e-cigarettes. If you need help quitting, ask your health care provider.  Contact your health care provider if you have any questions. Keep all prenatal visits as told by your health care provider. This is important. This information is not intended to replace advice given to you by  your health care provider. Make sure you discuss any questions you have with your health care provider. Document Released: 09/11/2001 Document Revised: 02/23/2016 Document Reviewed: 11/18/2012 Elsevier Interactive Patient Education  2017 [employer].

## 2016-12-12 NOTE — Progress Notes (Signed)
   PRENATAL VISIT NOTE  Subjective:  Beth Giles is a 31 y.o. [phone] at 6456w4d being seen today for ongoing prenatal care; [facility] history of PTD at 28 weeks.   She is currently monitored for the following issues for this high-risk pregnancy and has Supervision of high-risk pregnancy; History of preterm delivery, currently pregnant; Tobacco smoking affecting pregnancy in first trimester; and Anxiety during pregnancy, antepartum on her problem list.  Patient reports occasional anxiety, helped by cigarettes. Wants to quit smoking; smokes 3 cigarettes daily.  Wants medication that can help with anxiety and is safe in pregnancy..  Contractions: Not present. Vag. Bleeding: None.  Movement: Absent. Denies leaking of fluid.   The following portions of the patient's history were reviewed and updated as appropriate: allergies, current medications, past family history, past medical history, past social history, past surgical history and problem list. Problem list updated.  Objective:   Vitals:   12/12/16 0801 12/12/16 0812  BP: 113/72   Pulse: 88   Weight: 124 lb 1.6 oz (56.3 kg)   Height:  5\' 2"  (1.575 m)    Fetal Status: Fetal Heart Rate (bpm): 140   Movement: Absent     General:  Alert, oriented and cooperative. Patient is in no acute distress.  Skin: Skin is warm and dry. No rash noted.   Cardiovascular: Normal heart rate noted  Respiratory: Normal respiratory effort, no problems with respiration noted  Abdomen: Soft, gravid, appropriate for gestational age. Pain/Pressure: Absent     Pelvic:  Cervical exam deferred        Extremities: Normal range of motion.  Edema: None  Mental Status: Normal mood and affect. Normal behavior. Normal judgment and thought content.   Assessment and Plan:  Pregnancy: [id number] at 9256w4d  1. History of preterm delivery, currently pregnant Patient counseled about 17P, application done. Will start at 16 weeks. Serial cervical length  scans also ordered to start at 16 weeks.  - Flu Vaccine QUAD 36+ mos IM (Fluarix, Quad PF) - US MFM OB Transvaginal; Future  2. Anxiety during pregnancy, antepartum Will try Vistaril for now.  If this does not work, may try Buspirone. - hydrOXYzine (VISTARIL) 50 MG capsule; Take 1 capsule (50 mg total) by mouth 3 (three) times daily as needed.  Dispense: 30 capsule; Refill: 3  3. Tobacco smoking affecting pregnancy in first trimester Lowest patch ordered for patient.  Will talk to SW about smoking cessation programs.  - nicotine (NICODERM CQ - DOSED IN MG/24 HR) 7 mg/24hr patch; Place 1 patch (7 mg total) onto the skin daily.  Dispense: 28 patch; Refill: 12  4. Supervision of high risk pregnancy in second trimester Flu vaccine given, anatomy scan ordered.  Already had first trimester screen, normal NT/NB, analytes pending. AFP only next visit.  - Flu Vaccine QUAD 36+ mos IM (Fluarix, Quad PF) - US MFM OB COMP + 14 WK; Future No other complaints or concerns.  Routine obstetric precautions reviewed. Please refer to After Visit Summary for other counseling recommendations.  Return in about 2 weeks (around 12/26/2016) for 17P injection. Will need weekly 17P after.  4 weeks:  OB visit (HOB) and 17P.   Tereso NewcomerUgonna A Anyanwu, MD

## 2016-12-17 ENCOUNTER — Telehealth: Payer: Self-pay | Admitting: *Deleted

## 2016-12-17 NOTE — Telephone Encounter (Signed)
[facility] they could not read all of . Would like us to refax the Prescription form.  Form re- faxed.

## 2016-12-18 ENCOUNTER — Encounter: Payer: Self-pay | Admitting: *Deleted

## 2016-12-19 ENCOUNTER — Encounter (HOSPITAL_COMMUNITY): Payer: Self-pay

## 2016-12-19 ENCOUNTER — Ambulatory Visit (HOSPITAL_COMMUNITY)
Admission: RE | Admit: 2016-12-19 | Discharge: 2016-12-19 | Disposition: A | Discharge status: Home / Self Care | Payer: BLUE CROSS/BLUE SHIELD | Source: Ambulatory Visit | Attending: Obstetrics & Gynecology | Admitting: Obstetrics & Gynecology

## 2016-12-19 DIAGNOSIS — O09212 Supervision of pregnancy with history of pre-term labor, second trimester: Secondary | ICD-10-CM | POA: Insufficient documentation

## 2016-12-19 DIAGNOSIS — Z3A15 15 weeks gestation of pregnancy: Secondary | ICD-10-CM | POA: Insufficient documentation

## 2016-12-19 DIAGNOSIS — Z3686 Encounter for antenatal screening for cervical length: Secondary | ICD-10-CM | POA: Diagnosis not present

## 2016-12-19 DIAGNOSIS — O09899 Supervision of other high risk pregnancies, unspecified trimester: Secondary | ICD-10-CM

## 2016-12-19 DIAGNOSIS — O09219 Supervision of pregnancy with history of pre-term labor, unspecified trimester: Secondary | ICD-10-CM

## 2016-12-24 ENCOUNTER — Telehealth: Payer: Self-pay | Admitting: *Deleted

## 2016-12-24 NOTE — Telephone Encounter (Signed)
Received a voicemail from [facility] requesting a call back to confirm delivery information for emegency vial of makena.  I called [facility] and verified delivery information. We should receive the vial tomorrow.

## 2016-12-26 ENCOUNTER — Telehealth (HOSPITAL_COMMUNITY): Payer: Self-pay

## 2016-12-26 ENCOUNTER — Encounter: Payer: Self-pay | Admitting: General Practice

## 2016-12-26 ENCOUNTER — Ambulatory Visit (INDEPENDENT_AMBULATORY_CARE_PROVIDER_SITE_OTHER): Payer: BLUE CROSS/BLUE SHIELD | Admitting: Family Medicine

## 2016-12-26 ENCOUNTER — Other Ambulatory Visit: Payer: Self-pay | Admitting: Obstetrics and Gynecology

## 2016-12-26 ENCOUNTER — Ambulatory Visit: Payer: BLUE CROSS/BLUE SHIELD

## 2016-12-26 ENCOUNTER — Other Ambulatory Visit (HOSPITAL_COMMUNITY)
Admission: RE | Admit: 2016-12-26 | Discharge: 2016-12-26 | Disposition: A | Discharge status: Home / Self Care | Payer: BLUE CROSS/BLUE SHIELD | Source: Ambulatory Visit | Attending: Family Medicine | Admitting: Family Medicine

## 2016-12-26 ENCOUNTER — Other Ambulatory Visit: Payer: Self-pay | Admitting: Obstetrics & Gynecology

## 2016-12-26 ENCOUNTER — Encounter (HOSPITAL_COMMUNITY): Payer: Self-pay

## 2016-12-26 ENCOUNTER — Encounter (HOSPITAL_COMMUNITY): Payer: Self-pay | Admitting: *Deleted

## 2016-12-26 VITALS — BP 136/81 | HR 96 | Wt 128.0 lb

## 2016-12-26 DIAGNOSIS — O09212 Supervision of pregnancy with history of pre-term labor, second trimester: Secondary | ICD-10-CM | POA: Insufficient documentation

## 2016-12-26 DIAGNOSIS — O09899 Supervision of other high risk pregnancies, unspecified trimester: Secondary | ICD-10-CM

## 2016-12-26 DIAGNOSIS — O09892 Supervision of other high risk pregnancies, second trimester: Secondary | ICD-10-CM

## 2016-12-26 DIAGNOSIS — N760 Acute vaginitis: Secondary | ICD-10-CM | POA: Diagnosis not present

## 2016-12-26 DIAGNOSIS — B9689 Other specified bacterial agents as the cause of diseases classified elsewhere: Secondary | ICD-10-CM

## 2016-12-26 DIAGNOSIS — O26872 Cervical shortening, second trimester: Secondary | ICD-10-CM | POA: Diagnosis not present

## 2016-12-26 DIAGNOSIS — O23592 Infection of other part of genital tract in pregnancy, second trimester: Secondary | ICD-10-CM | POA: Diagnosis not present

## 2016-12-26 DIAGNOSIS — O0992 Supervision of high risk pregnancy, unspecified, second trimester: Secondary | ICD-10-CM

## 2016-12-26 DIAGNOSIS — O09219 Supervision of pregnancy with history of pre-term labor, unspecified trimester: Secondary | ICD-10-CM

## 2016-12-26 DIAGNOSIS — Z3A16 16 weeks gestation of pregnancy: Secondary | ICD-10-CM | POA: Diagnosis not present

## 2016-12-26 MED ORDER — METRONIDAZOLE 500 MG PO TABS: 500.0000 mg | ORAL_TABLET | Freq: Two times a day (BID) | ORAL | 0 refills | Status: DC

## 2016-12-26 MED ORDER — HYDROXYPROGESTERONE CAPROATE 250 MG/ML IM OIL
250.0000 mg | TOPICAL_OIL | INTRAMUSCULAR | Status: AC
  Administered 2016-12-26 – 2017-05-10 (×18): 250 mg via INTRAMUSCULAR

## 2016-12-26 NOTE — Progress Notes (Signed)
   PRENATAL VISIT NOTE  Subjective:  Beth Giles is a 31 y.o. [phone] at 6039w4d being seen today for ongoing prenatal care.  She is currently monitored for the following issues for this high-risk pregnancy and has Supervision of high-risk pregnancy; History of preterm delivery, currently pregnant; Tobacco smoking affecting pregnancy in first trimester; Anxiety during pregnancy, antepartum; and Short cervical length during pregnancy in second trimester on her problem list.  Patient reports no complaints.  Contractions: Not present. Vag. Bleeding: None.  Movement: Absent. Denies leaking of fluid.   The following portions of the patient's history were reviewed and updated as appropriate: allergies, current medications, past family history, past medical history, past social history, past surgical history and problem list. Problem list updated.  Objective:   Vitals:   12/26/16 0858  BP: 136/81  Pulse: 96  Weight: 128 lb (58.1 kg)    Fetal Status: Fetal Heart Rate (bpm): 140   Movement: Absent     General:  Alert, oriented and cooperative. Patient is in no acute distress.  Skin: Skin is warm and dry. No rash noted.   Cardiovascular: Normal heart rate noted  Respiratory: Normal respiratory effort, no problems with respiration noted  Abdomen: Soft, gravid, appropriate for gestational age. Pain/Pressure: Absent     Pelvic:  Cervical exam performed Dilation: Closed Effacement (%): Thick Station: Ballotable  Extremities: Normal range of motion.  Edema: None  Mental Status: Normal mood and affect. Normal behavior. Normal judgment and thought content.  U/S 3/21 Normal amniotic fluid volume EV views of cervix: shortened cervical length of 2.1 - 2.3 cms  without funneling The US findings were shared with Ms. Thamas JaegersLennon. With her  history of a 28 week delivery and a CL < 2.5 cms, she is a  candidate for a cerclage.  Assessment and Plan:  Pregnancy: [id number] at 4439w4d  1. History of preterm  delivery, currently pregnant in second trimester Begin 17 P  For cerclage based on MFM recs with short cervix. - hydroxyprogesterone caproate (MAKENA) 250 mg/mL injection 250 mg; Inject 1 mL (250 mg total) into the muscle once a week.   2. Supervision of high risk pregnancy in second trimester - AFP, Serum, Open Spina Bifida - HIV antibody   3. Short cervical length during pregnancy in second trimester For cerclage  4. Bacterial vaginosis - Cervicovaginal ancillary only - wet prep - metroNIDAZOLE (FLAGYL) 500 MG tablet; Take 1 tablet (500 mg total) by mouth 2 (two) times daily.  Dispense: 14 tablet; Refill: 0  Preterm labor symptoms and general obstetric precautions including but not limited to vaginal bleeding, contractions, leaking of fluid and fetal movement were reviewed in detail with the patient. Please refer to After Visit Summary for other counseling recommendations.    Reva Boresanya S Colletta Spillers, MD

## 2016-12-26 NOTE — Patient Instructions (Signed)
Breastfeeding Deciding to breastfeed is one of the best choices you can make for you and your baby. A change in hormones during pregnancy causes your breast tissue to grow and increases the number and size of your milk ducts. These hormones also allow proteins, sugars, and fats from your blood supply to make breast milk in your milk-producing glands. Hormones prevent breast milk from being released before your baby is born as well as prompt milk flow after birth. Once breastfeeding has begun, thoughts of your baby, as well as his or her sucking or crying, can stimulate the release of milk from your milk-producing glands. Benefits of breastfeeding For Your Baby  Your first milk (colostrum) helps your baby's digestive system function better.  There are antibodies in your milk that help your baby fight off infections.  Your baby has a lower incidence of asthma, allergies, and sudden infant death syndrome.  The nutrients in breast milk are better for your baby than infant formulas and are designed uniquely for your baby's needs.  Breast milk improves your baby's brain development.  Your baby is less likely to develop other conditions, such as childhood obesity, asthma, or type 2 diabetes mellitus.  For You  Breastfeeding helps to create a very special bond between you and your baby.  Breastfeeding is convenient. Breast milk is always available at the correct temperature and costs nothing.  Breastfeeding helps to burn calories and helps you lose the weight gained during pregnancy.  Breastfeeding makes your uterus contract to its prepregnancy size faster and slows bleeding (lochia) after you give birth.  Breastfeeding helps to lower your risk of developing type 2 diabetes mellitus, osteoporosis, and breast or ovarian cancer later in life.  Signs that your baby is hungry Early Signs of Hunger  Increased alertness or activity.  Stretching.  Movement of the head from side to  side.  Movement of the head and opening of the mouth when the corner of the mouth or cheek is stroked (rooting).  Increased sucking sounds, smacking lips, cooing, sighing, or squeaking.  Hand-to-mouth movements.  Increased sucking of fingers or hands.  Late Signs of Hunger  Fussing.  Intermittent crying.  Extreme Signs of Hunger Signs of extreme hunger will require calming and consoling before your baby will be able to breastfeed successfully. Do not wait for the following signs of extreme hunger to occur before you initiate breastfeeding:  Restlessness.  A loud, strong cry.  Screaming.  Breastfeeding basics Breastfeeding Initiation  Find a comfortable place to sit or lie down, with your neck and back well supported.  Place a pillow or rolled up blanket under your baby to bring him or her to the level of your breast (if you are seated). Nursing pillows are specially designed to help support your arms and your baby while you breastfeed.  Make sure that your baby's abdomen is facing your abdomen.  Gently massage your breast. With your fingertips, massage from your chest wall toward your nipple in a circular motion. This encourages milk flow. You may need to continue this action during the feeding if your milk flows slowly.  Support your breast with 4 fingers underneath and your thumb above your nipple. Make sure your fingers are well away from your nipple and your baby's mouth.  Stroke your baby's lips gently with your finger or nipple.  When your baby's mouth is open wide enough, quickly bring your baby to your breast, placing your entire nipple and as much of the colored area   around your nipple (areola) as possible into your baby's mouth. ? More areola should be visible above your baby's upper lip than below the lower lip. ? Your baby's tongue should be between his or her lower gum and your breast.  Ensure that your baby's mouth is correctly positioned around your nipple  (latched). Your baby's lips should create a seal on your breast and be turned out (everted).  It is common for your baby to suck about 2-3 minutes in order to start the flow of breast milk.  Latching Teaching your baby how to latch on to your breast properly is very important. An improper latch can cause nipple pain and decreased milk supply for you and poor weight gain in your baby. Also, if your baby is not latched onto your nipple properly, he or she may swallow some air during feeding. This can make your baby fussy. Burping your baby when you switch breasts during the feeding can help to get rid of the air. However, teaching your baby to latch on properly is still the best way to prevent fussiness from swallowing air while breastfeeding. Signs that your baby has successfully latched on to your nipple:  Silent tugging or silent sucking, without causing you pain.  Swallowing heard between every 3-4 sucks.  Muscle movement above and in front of his or her ears while sucking.  Signs that your baby has not successfully latched on to nipple:  Sucking sounds or smacking sounds from your baby while breastfeeding.  Nipple pain.  If you think your baby has not latched on correctly, slip your finger into the corner of your baby's mouth to break the suction and place it between your baby's gums. Attempt breastfeeding initiation again. Signs of Successful Breastfeeding Signs from your baby:  A gradual decrease in the number of sucks or complete cessation of sucking.  Falling asleep.  Relaxation of his or her body.  Retention of a small amount of milk in his or her mouth.  Letting go of your breast by himself or herself.  Signs from you:  Breasts that have increased in firmness, weight, and size 1-3 hours after feeding.  Breasts that are softer immediately after breastfeeding.  Increased milk volume, as well as a change in milk consistency and color by the fifth day of  breastfeeding.  Nipples that are not sore, cracked, or bleeding.  Signs That Your Baby is Getting Enough Milk  Wetting at least 1-2 diapers during the first 24 hours after birth.  Wetting at least 5-6 diapers every 24 hours for the first week after birth. The urine should be clear or pale yellow by 5 days after birth.  Wetting 6-8 diapers every 24 hours as your baby continues to grow and develop.  At least 3 stools in a 24-hour period by age 5 days. The stool should be soft and yellow.  At least 3 stools in a 24-hour period by age 7 days. The stool should be seedy and yellow.  No loss of weight greater than 10% of birth weight during the first 3 days of age.  Average weight gain of 4-7 ounces (113-198 g) per week after age 4 days.  Consistent daily weight gain by age 5 days, without weight loss after the age of 2 weeks.  After a feeding, your baby may spit up a small amount. This is common. Breastfeeding frequency and duration Frequent feeding will help you make more milk and can prevent sore nipples and breast engorgement. Breastfeed when   you feel the need to reduce the fullness of your breasts or when your baby shows signs of hunger. This is called "breastfeeding on demand." Avoid introducing a pacifier to your baby while you are working to establish breastfeeding (the first 4-6 weeks after your baby is born). After this time you may choose to use a pacifier. Research has shown that pacifier use during the first year of a baby's life decreases the risk of sudden infant death syndrome (SIDS). Allow your baby to feed on each breast as long as he or she wants. Breastfeed until your baby is finished feeding. When your baby unlatches or falls asleep while feeding from the first breast, offer the second breast. Because newborns are often sleepy in the first few weeks of life, you may need to awaken your baby to get him or her to feed. Breastfeeding times will vary from baby to baby. However,  the following rules can serve as a guide to help you ensure that your baby is properly fed:  Newborns (babies 4 weeks of age or younger) may breastfeed every 1-3 hours.  Newborns should not go longer than 3 hours during the day or 5 hours during the night without breastfeeding.  You should breastfeed your baby a minimum of 8 times in a 24-hour period until you begin to introduce solid foods to your baby at around 6 months of age.  Breast milk pumping Pumping and storing breast milk allows you to ensure that your baby is exclusively fed your breast milk, even at times when you are unable to breastfeed. This is especially important if you are going back to work while you are still breastfeeding or when you are not able to be present during feedings. Your lactation consultant can give you guidelines on how long it is safe to store breast milk. A breast pump is a machine that allows you to pump milk from your breast into a sterile bottle. The pumped breast milk can then be stored in a refrigerator or freezer. Some breast pumps are operated by hand, while others use electricity. Ask your lactation consultant which type will work best for you. Breast pumps can be purchased, but some hospitals and breastfeeding support groups lease breast pumps on a monthly basis. A lactation consultant can teach you how to hand express breast milk, if you prefer not to use a pump. Caring for your breasts while you breastfeed Nipples can become dry, cracked, and sore while breastfeeding. The following recommendations can help keep your breasts moisturized and healthy:  Avoid using soap on your nipples.  Wear a supportive bra. Although not required, special nursing bras and tank tops are designed to allow access to your breasts for breastfeeding without taking off your entire bra or top. Avoid wearing underwire-style bras or extremely tight bras.  Air dry your nipples for 3-4minutes after each feeding.  Use only cotton  bra pads to absorb leaked breast milk. Leaking of breast milk between feedings is normal.  Use lanolin on your nipples after breastfeeding. Lanolin helps to maintain your skin's normal moisture barrier. If you use pure lanolin, you do not need to wash it off before feeding your baby again. Pure lanolin is not toxic to your baby. You may also hand express a few drops of breast milk and gently massage that milk into your nipples and allow the milk to air dry.  In the first few weeks after giving birth, some women experience extremely full breasts (engorgement). Engorgement can make your   breasts feel heavy, warm, and tender to the touch. Engorgement peaks within 3-5 days after you give birth. The following recommendations can help ease engorgement:  Completely empty your breasts while breastfeeding or pumping. You may want to start by applying warm, moist heat (in the shower or with warm water-soaked hand towels) just before feeding or pumping. This increases circulation and helps the milk flow. If your baby does not completely empty your breasts while breastfeeding, pump any extra milk after he or she is finished.  Wear a snug bra (nursing or regular) or tank top for 1-2 days to signal your body to slightly decrease milk production.  Apply ice packs to your breasts, unless this is too uncomfortable for you.  Make sure that your baby is latched on and positioned properly while breastfeeding.  If engorgement persists after 48 hours of following these recommendations, contact your health care provider or a lactation consultant. Overall health care recommendations while breastfeeding  Eat healthy foods. Alternate between meals and snacks, eating 3 of each per day. Because what you eat affects your breast milk, some of the foods may make your baby more irritable than usual. Avoid eating these foods if you are sure that they are negatively affecting your baby.  Drink milk, fruit juice, and water to  satisfy your thirst (about 10 glasses a day).  Rest often, relax, and continue to take your prenatal vitamins to prevent fatigue, stress, and anemia.  Continue breast self-awareness checks.  Avoid chewing and smoking tobacco. Chemicals from cigarettes that pass into breast milk and exposure to secondhand smoke may harm your baby.  Avoid alcohol and drug use, including marijuana. Some medicines that may be harmful to your baby can pass through breast milk. It is important to ask your health care provider before taking any medicine, including all over-the-counter and prescription medicine as well as vitamin and herbal supplements. It is possible to become pregnant while breastfeeding. If birth control is desired, ask your health care provider about options that will be safe for your baby. Contact a health care provider if:  You feel like you want to stop breastfeeding or have become frustrated with breastfeeding.  You have painful breasts or nipples.  Your nipples are cracked or bleeding.  Your breasts are red, tender, or warm.  You have a swollen area on either breast.  You have a fever or chills.  You have nausea or vomiting.  You have drainage other than breast milk from your nipples.  Your breasts do not become full before feedings by the fifth day after you give birth.  You feel sad and depressed.  Your baby is too sleepy to eat well.  Your baby is having trouble sleeping.  Your baby is wetting less than 3 diapers in a 24-hour period.  Your baby has less than 3 stools in a 24-hour period.  Your baby's skin or the white part of his or her eyes becomes yellow.  Your baby is not gaining weight by 5 days of age. Get help right away if:  Your baby is overly tired (lethargic) and does not want to wake up and feed.  Your baby develops an unexplained fever. This information is not intended to replace advice given to you by your health care provider. Make sure you discuss  any questions you have with your health care provider. Document Released: 09/17/2005 Document Revised: 02/29/2016 Document Reviewed: 03/11/2013 Elsevier Interactive Patient Education  2017 [employer]  

## 2016-12-26 NOTE — Progress Notes (Signed)
Pt states that she thinks she has a bv infection.  Called [facility] regarding patients Makena refill, we received one vial yesterday, dose given today. Per representative pt received an emergency vial. Spoke with patients case manager and she stated that they are just waiting for the pharmacy to process the patient, they will be following up on Friday with them and if they aren't ready to ship Makena then, she will be sent another emergency vial prior to her next injection on Wednesday. No further action needed on our part.

## 2016-12-26 NOTE — Telephone Encounter (Signed)
Called pt to give her surgery date and time, no answer, left voicemail with surgery date and time as well as some basic Pre-op instructions, "arrival time @0930 , NPO after midnight, no lotions or perfumes, no jewelery"  Requesting that patient call me back at the office to confirm receipt of this message.  Awaiting a call back.

## 2016-12-27 ENCOUNTER — Telehealth: Payer: Self-pay | Admitting: *Deleted

## 2016-12-27 LAB — CERVICOVAGINAL ANCILLARY ONLY
BACTERIAL VAGINITIS: POSITIVE — AB
Candida vaginitis: NEGATIVE
Trichomonas: NEGATIVE

## 2016-12-27 NOTE — Telephone Encounter (Signed)
[facility] and they need us to call them to provide insurance information.

## 2016-12-28 LAB — AFP, SERUM, OPEN SPINA BIFIDA
AFP MoM: 1.24
AFP Value: 47.8 ng/mL
GEST. AGE ON COLLECTION DATE: 16.4 wk
MATERNAL AGE AT EDD: 30.2 a
OSBR Risk 1 IN: 5748
TEST RESULTS AFP: NEGATIVE
WEIGHT: 128 [lb_av]

## 2016-12-28 LAB — HIV ANTIBODY (ROUTINE TESTING W REFLEX): HIV Screen 4th Generation wRfx: NONREACTIVE

## 2016-12-31 ENCOUNTER — Encounter (HOSPITAL_COMMUNITY): Payer: Self-pay | Admitting: *Deleted

## 2016-12-31 ENCOUNTER — Encounter (HOSPITAL_COMMUNITY)
Admission: RE | Disposition: A | Discharge status: Home / Self Care | Payer: Self-pay | Source: Ambulatory Visit | Attending: Obstetrics and Gynecology

## 2016-12-31 ENCOUNTER — Ambulatory Visit (HOSPITAL_COMMUNITY): Payer: BLUE CROSS/BLUE SHIELD | Admitting: Certified Registered Nurse Anesthetist

## 2016-12-31 ENCOUNTER — Ambulatory Visit (HOSPITAL_COMMUNITY)
Admission: RE | Admit: 2016-12-31 | Discharge: 2016-12-31 | Disposition: A | Discharge status: Home / Self Care | Payer: BLUE CROSS/BLUE SHIELD | Source: Ambulatory Visit | Attending: Obstetrics and Gynecology | Admitting: Obstetrics and Gynecology

## 2016-12-31 DIAGNOSIS — O23592 Infection of other part of genital tract in pregnancy, second trimester: Secondary | ICD-10-CM | POA: Diagnosis not present

## 2016-12-31 DIAGNOSIS — O0990 Supervision of high risk pregnancy, unspecified, unspecified trimester: Secondary | ICD-10-CM

## 2016-12-31 DIAGNOSIS — B9689 Other specified bacterial agents as the cause of diseases classified elsewhere: Secondary | ICD-10-CM | POA: Diagnosis not present

## 2016-12-31 DIAGNOSIS — Z3A17 17 weeks gestation of pregnancy: Secondary | ICD-10-CM | POA: Insufficient documentation

## 2016-12-31 DIAGNOSIS — Z87891 Personal history of nicotine dependence: Secondary | ICD-10-CM | POA: Diagnosis not present

## 2016-12-31 DIAGNOSIS — F419 Anxiety disorder, unspecified: Secondary | ICD-10-CM | POA: Diagnosis not present

## 2016-12-31 DIAGNOSIS — O26872 Cervical shortening, second trimester: Secondary | ICD-10-CM | POA: Insufficient documentation

## 2016-12-31 DIAGNOSIS — O09219 Supervision of pregnancy with history of pre-term labor, unspecified trimester: Secondary | ICD-10-CM

## 2016-12-31 DIAGNOSIS — O99342 Other mental disorders complicating pregnancy, second trimester: Secondary | ICD-10-CM | POA: Diagnosis not present

## 2016-12-31 DIAGNOSIS — O09899 Supervision of other high risk pregnancies, unspecified trimester: Secondary | ICD-10-CM

## 2016-12-31 DIAGNOSIS — O3432 Maternal care for cervical incompetence, second trimester: Secondary | ICD-10-CM | POA: Insufficient documentation

## 2016-12-31 HISTORY — DX: Encounter for elective termination of pregnancy: Z33.2

## 2016-12-31 HISTORY — PX: CERVICAL CERCLAGE: SHX1329

## 2016-12-31 HISTORY — DX: Anxiety disorder, unspecified: F41.9

## 2016-12-31 LAB — CBC
HCT: 30.6 % — ABNORMAL LOW (ref 36.0–46.0)
HEMOGLOBIN: 10.5 g/dL — AB (ref 12.0–15.0)
MCH: 34.4 pg — AB (ref 26.0–34.0)
MCHC: 34.3 g/dL (ref 30.0–36.0)
MCV: 100.3 fL — ABNORMAL HIGH (ref 78.0–100.0)
PLATELETS: 309 10*3/uL (ref 150–400)
RBC: 3.05 MIL/uL — ABNORMAL LOW (ref 3.87–5.11)
RDW: 12.2 % (ref 11.5–15.5)
WBC: 7.3 10*3/uL (ref 4.0–10.5)

## 2016-12-31 SURGERY — CERCLAGE, CERVIX, VAGINAL APPROACH
Anesthesia: Spinal | Site: Vagina

## 2016-12-31 MED ORDER — FENTANYL CITRATE (PF) 100 MCG/2ML IJ SOLN: 25.0000 ug | INTRAMUSCULAR | Status: DC | PRN

## 2016-12-31 MED ORDER — EPHEDRINE 5 MG/ML INJ
INTRAVENOUS | Status: AC
  Filled 2016-12-31: qty 10

## 2016-12-31 MED ORDER — GLYCOPYRROLATE 0.2 MG/ML IJ SOLN
INTRAMUSCULAR | Status: DC | PRN
  Administered 2016-12-31: 0.2 mg via INTRAVENOUS

## 2016-12-31 MED ORDER — LACTATED RINGERS IV SOLN
INTRAVENOUS | Status: DC
  Administered 2016-12-31: 12:00:00 via INTRAVENOUS

## 2016-12-31 MED ORDER — ONDANSETRON HCL 4 MG/2ML IJ SOLN
INTRAMUSCULAR | Status: DC | PRN
  Administered 2016-12-31: 4 mg via INTRAVENOUS

## 2016-12-31 MED ORDER — OXYCODONE HCL 5 MG/5ML PO SOLN: 5.0000 mg | Freq: Once | ORAL | Status: DC | PRN

## 2016-12-31 MED ORDER — OXYCODONE HCL 5 MG PO TABS: 5.0000 mg | ORAL_TABLET | Freq: Once | ORAL | Status: DC | PRN

## 2016-12-31 MED ORDER — BUPIVACAINE IN DEXTROSE 0.75-8.25 % IT SOLN
INTRATHECAL | Status: DC | PRN
  Administered 2016-12-31: 1.2 mL via INTRATHECAL

## 2016-12-31 MED ORDER — IBUPROFEN 600 MG PO TABS: 600.0000 mg | ORAL_TABLET | Freq: Four times a day (QID) | ORAL | 0 refills | Status: DC | PRN

## 2016-12-31 MED ORDER — PHENYLEPHRINE 40 MCG/ML (10ML) SYRINGE FOR IV PUSH (FOR BLOOD PRESSURE SUPPORT)
PREFILLED_SYRINGE | INTRAVENOUS | Status: AC
  Filled 2016-12-31: qty 10

## 2016-12-31 MED ORDER — PHENYLEPHRINE HCL 10 MG/ML IJ SOLN
INTRAMUSCULAR | Status: DC | PRN
  Administered 2016-12-31: 80 ug via INTRAVENOUS

## 2016-12-31 MED ORDER — ONDANSETRON HCL 4 MG/2ML IJ SOLN
INTRAMUSCULAR | Status: AC
  Filled 2016-12-31: qty 2

## 2016-12-31 MED ORDER — PROMETHAZINE HCL 25 MG/ML IJ SOLN: 6.2500 mg | INTRAMUSCULAR | Status: DC | PRN

## 2016-12-31 MED ORDER — KETOROLAC TROMETHAMINE 30 MG/ML IJ SOLN: 30.0000 mg | Freq: Once | INTRAMUSCULAR | Status: DC | PRN

## 2016-12-31 MED ORDER — GLYCOPYRROLATE 0.2 MG/ML IJ SOLN
INTRAMUSCULAR | Status: AC
  Filled 2016-12-31: qty 1

## 2016-12-31 MED ORDER — MEPERIDINE HCL 25 MG/ML IJ SOLN: 6.2500 mg | INTRAMUSCULAR | Status: DC | PRN

## 2016-12-31 SURGICAL SUPPLY — 16 items
CANISTER SUCT 3000ML PPV (MISCELLANEOUS) IMPLANT
CLOTH BEACON ORANGE TIMEOUT ST (SAFETY) ×2 IMPLANT
COUNTER NEEDLE 1200 MAGNETIC (NEEDLE) ×2 IMPLANT
GLOVE BIO SURGEON STRL SZ 6.5 (GLOVE) ×2 IMPLANT
GLOVE BIOGEL PI IND STRL 7.0 (GLOVE) ×2 IMPLANT
GLOVE BIOGEL PI INDICATOR 7.0 (GLOVE) ×2
GOWN STRL REUS W/TWL LRG LVL3 (GOWN DISPOSABLE) ×4 IMPLANT
NS IRRIG 1000ML POUR BTL (IV SOLUTION) ×2 IMPLANT
PACK VAGINAL MINOR WOMEN LF (CUSTOM PROCEDURE TRAY) ×2 IMPLANT
PAD OB MATERNITY 4.3X12.25 (PERSONAL CARE ITEMS) ×2 IMPLANT
PAD PREP 24X48 CUFFED NSTRL (MISCELLANEOUS) ×2 IMPLANT
SUT PROLENE 1 CT 1 30 (SUTURE) ×2 IMPLANT
TOWEL OR 17X24 6PK STRL BLUE (TOWEL DISPOSABLE) ×4 IMPLANT
TRAY FOLEY CATH SILVER 14FR (SET/KITS/TRAYS/PACK) ×2 IMPLANT
TUBING NON-CON 1/4 X 20 CONN (TUBING) IMPLANT
YANKAUER SUCT BULB TIP NO VENT (SUCTIONS) IMPLANT

## 2016-12-31 NOTE — Transfer of Care (Signed)
Immediate Anesthesia Transfer of Care Note  Patient: Beth Giles  Procedure(s) Performed: Procedure(s): CERCLAGE CERVICAL (N/A)  Patient Location: PACU  Anesthesia Type:Spinal  Level of Consciousness: awake, alert  and oriented  Airway & Oxygen Therapy: Patient Spontanous Breathing  Post-op Assessment: Report given to RN and Post -op Vital signs reviewed and stable  Post vital signs: Reviewed and stable  Last Vitals:  Vitals:   12/31/16 1115  BP: 126/70  Pulse: 75  Resp: 18  Temp: 37.1 C    Last Pain:  Vitals:   12/31/16 1115  TempSrc: Oral      Patients Stated Pain Goal: 3 (12/31/16 1115)  Complications: No apparent anesthesia complications

## 2016-12-31 NOTE — Anesthesia Procedure Notes (Signed)
Spinal  Patient location during procedure: OR Staffing Anesthesiologist: Nolon Nations Performed: anesthesiologist  Preanesthetic Checklist Completed: patient identified, site marked, surgical consent, pre-op evaluation, timeout performed, IV checked, risks and benefits discussed and monitors and equipment checked Spinal Block Patient position: sitting Prep: site prepped and draped and DuraPrep Patient monitoring: heart rate, continuous pulse ox and blood pressure Approach: midline Location: L4-5 Injection technique: single-shot Needle Needle type: Sprotte  Needle gauge: 24 G Needle length: 9 cm Additional Notes Expiration date of kit checked and confirmed. Patient tolerated procedure well, without complications.

## 2016-12-31 NOTE — Op Note (Signed)
Beth Giles   PROCEDURE DATE: 12/31/2016  PREOPERATIVE DIAGNOSIS: Intrauterine pregnancy at [redacted]w[redacted]d, history of cervical incompetence   POSTOPERATIVE DIAGNOSIS: The same PROCEDURE: Transvaginal McDonald Cervical Cerclage Placement SURGEON: Adam Phenix, MD   INDICATIONS: 31 y.o. [phone] at [redacted]w[redacted]d with history of cervical incompetence and short cervix, here for cerclage placement.   The risks of surgery were discussed in detail with the patient including but not limited to: bleeding; infection which may require antibiotic therapy; injury to cervix, vagina other surrounding organs; risk of ruptured membranes and/or preterm delivery and other postoperative or anesthesia complications.  Written informed consent was obtained.    FINDINGS:  About 2 cm palpable cervical length in the vagina, closed cervix, suture knot placed anteriorly.  ANESTHESIA:  Spinal INTRAVENOUS FLUIDS: 1000  ml ESTIMATED BLOOD LOSS: 5 ml COMPLICATIONS: None immediate  PROCEDURE IN DETAIL:  The patient received intravenous antibiotics and had sequential compression devices applied to her lower extremities while in the preoperative area.  Reassuring fetal heart rate was also obtained using a doppler. She was then taken to the operating room where spinal anesthesia was administered and was found to be adequate.  She was placed in the dorsal lithotomy, and was prepped and draped in a sterile manner. Her bladder was catheterized for an unmeasured amount of clear, yellow urine.    After an adequate timeout was performed, a vaginal speculum was then placed in the patient's vagina and a single tooth tenaculum was applied to the anterior lip of the cervix.  The anterior and posterior lips of the cervix was grasped with ring forceps. A curved needle loaded with a 1 Prolene suture was inserted at 12 o'clock, as high as possible at the junction of the rugated vaginal epithelium and the smooth cervix, at least 2 cm above the external os.   Four bites are taken circumferentially around the entire cervix in a purse-string fashion, each bite should be deep enough to extend at least midway into the cervical stroma, but not into the endocervical canal. The two ends of the suture were then tied securely anteriorly and cut, leaving the ends long enough to grasp with a clamp when it is time to remove it. There was minimal bleeding noted and the ring forceps were removed with good hemostasis noted.  All instruments were removed from the patient's vagina.  Instrument, needle and sponge counts were correct x 2. The patient tolerated the procedure well, and was taken to the recovery area awake and in stable condition. Reassuring fetal heart rate was also obtained using a doppler in the recovery area.  The patient will be discharged to home as per PACU criteria.  Routine postoperative instructions given.  She was prescribed Percocet and Colace.  She will follow up in the clinic on 01/09/2017 for postoperative evaluation and ongoing prenatal care.  Adam Phenix, MD Attending Obstetrician & Gynecologist Faculty [facility], [facility]

## 2016-12-31 NOTE — Anesthesia Preprocedure Evaluation (Signed)
Anesthesia Evaluation  Patient identified by MRN, date of birth, ID band Patient awake    Reviewed: Allergy & Precautions, NPO status , Patient's Chart, lab work & pertinent test results  Airway Mallampati: I  TM Distance: >3 FB Neck ROM: Full    Dental no notable dental hx.    Pulmonary neg pulmonary ROS, former smoker,    Pulmonary exam normal breath sounds clear to auscultation       Cardiovascular negative cardio ROS Normal cardiovascular exam Rhythm:Regular Rate:Normal     Neuro/Psych negative neurological ROS  negative psych ROS   GI/Hepatic negative GI ROS, Neg liver ROS,   Endo/Other  negative endocrine ROS  Renal/GU negative Renal ROS     Musculoskeletal negative musculoskeletal ROS (+)   Abdominal   Peds  Hematology negative hematology ROS (+)   Anesthesia Other Findings   Reproductive/Obstetrics (+) Pregnancy                             Anesthesia Physical Anesthesia Plan  ASA: II  Anesthesia Plan: Spinal   Post-op Pain Management:    Induction:   Airway Management Planned:   Additional Equipment:   Intra-op Plan:   Post-operative Plan:   Informed Consent: I have reviewed the patients History and Physical, chart, labs and discussed the procedure including the risks, benefits and alternatives for the proposed anesthesia with the patient or authorized representative who has indicated his/her understanding and acceptance.   Dental advisory given  Plan Discussed with: CRNA  Anesthesia Plan Comments:         Anesthesia Quick Evaluation

## 2016-12-31 NOTE — Discharge Instructions (Signed)
DISCHARGE INSTRUCTIONS: D&C / D&E The following instructions have been prepared to help you care for yourself upon your return home.   Personal hygiene:  Use sanitary pads for vaginal drainage, not tampons.  Shower the day after your procedure.  NO tub baths, pools or Jacuzzis for 2-3 weeks.  Wipe front to back after using the bathroom.  Activity and limitations:  Do NOT drive or operate any equipment for 24 hours. The effects of anesthesia are still present and drowsiness may result.  Do NOT rest in bed all day.  Walking is encouraged.  Walk up and down stairs slowly.  You may resume your normal activity in one to two days or as indicated by your physician.  Sexual activity: NO intercourse for at least 2 weeks after the procedure, or as indicated by your physician.  Diet: Eat a light meal as desired this evening. You may resume your usual diet tomorrow.  Return to work: You may resume your work activities in one to two days or as indicated by your doctor.  What to expect after your surgery: Expect to have vaginal bleeding/discharge for 2-3 days and spotting for up to 10 days. It is not unusual to have soreness for up to 1-2 weeks. You may have a slight burning sensation when you urinate for the first day. Mild cramps may continue for a couple of days. You may have a regular period in 2-6 weeks.  Call your doctor for any of the following:  Excessive vaginal bleeding, saturating and changing one pad every hour.  Inability to urinate 6 hours after discharge from hospital.  Pain not relieved by pain medication.  Fever of 100.4 F or greater.  Unusual vaginal discharge or odor.   Call for an appointment:    Patients signature: ______________________  Nurses signature ________________________  Support person's signature_______________________   Cervical Cerclage, Care After This sheet gives you information about how to care for yourself after your procedure. Your  health care provider may also give you more specific instructions. If you have problems or questions, contact your health care provider. What can I expect after the procedure? After your procedure, it is common to have:  Cramping in your abdomen.  Mucus discharge for several days.  Painful urination (dysuria).  Small drops of blood coming from your vagina (spotting). Follow these instructions at home:  Follow instructions from your health care provider about bed rest, if this applies. You may need to be on bed rest for up to 3 days.  Take over-the-counter and prescription medicines only as told by your health care provider.  Do not drive or use heavy machinery while taking prescription pain medicine.  Keep track of your vaginal discharge and watch for any changes. If you notice changes, tell your health care provider.  Avoid physical activities and exercise until your health care provider approves. Ask your health care provider what activities are safe for you.  Until your health care provider approves:  Do not douche.  Do not have sexual intercourse.  Keep all pre-birth (prenatal) visits and all follow-up visits as told by your health care provider. This is important. You will probably have weekly visits to have your cervix checked, and you may need an ultrasound. Contact a health care provider if:  You have abnormal or bad-smelling vaginal discharge, such as clots.  You develop a rash on your skin. This may look like redness and swelling.  You become light-headed or feel like you are going to faint.  You have abdominal pain that does not get better with medicine.  You have persistent nausea or vomiting. Get help right away if:  You have vaginal bleeding that is heavier or more frequent than spotting.  You are leaking fluid or have a gush of fluid from your vagina (your water breaks).  You have a fever or chills.  You faint.  You have uterine contractions. These  may feel like:  A back ache.  Lower abdominal pain.  Mild cramps, similar to menstrual cramps.  Tightening or pressure in your abdomen.  You think that your baby is not moving as much as usual, or you cannot feel your baby move.  You have chest pain.  You have shortness of breath. This information is not intended to replace advice given to you by your health care provider. Make sure you discuss any questions you have with your health care provider. Document Released: 07/08/2013 Document Revised: 05/16/2016 Document Reviewed: 04/20/2016 Elsevier Interactive Patient Education  2017 [employer].

## 2016-12-31 NOTE — H&P (Signed)
Beth Giles is a 31 y.o. [phone] at [redacted]w[redacted]d being seen today for cervical cerclage.  She is currently monitored for the following issues for this high-risk pregnancy and has Supervision of high-risk pregnancy; History of preterm delivery, currently pregnant; Tobacco smoking affecting pregnancy in first trimester; Anxiety during pregnancy, antepartum; and Short cervical length during pregnancy in second trimester on her problem list.  Patient reports no complaints.  Contractions: Not present. Vag. Bleeding: None.  Movement: Absent. Denies leaking of fluid.   The following portions of the patient's history were reviewed and updated as appropriate: allergies, current medications, past family history, past medical history, past social history, past surgical history and problem list. Problem list updated.  Objective:      Vitals:   12/26/16 0858  BP: 136/81  Pulse: 96  Weight: 128 lb (58.1 kg)  Blood pressure 126/70, pulse 75, temperature 98.7 F (37.1 C), temperature source Oral, resp. rate 18, height  (1.575 m), weight 58.1 kg (128 lb), last menstrual period 09/01/2016, SpO2 100 %.   Fetal Status: Fetal Heart Rate (bpm): 140   Movement: Absent     General:  Alert, oriented and cooperative. Patient is in no acute distress.  Skin: Skin is warm and dry. No rash noted.   Cardiovascular: Normal heart rate noted  Respiratory: Normal respiratory effort, no problems with respiration noted  Abdomen: Soft, gravid, appropriate for gestational age. Pain/Pressure: Absent     Pelvic:  Cervical exam performed Dilation: Closed Effacement (%): Thick Station: Ballotable  Extremities: Normal range of motion.  Edema: None  Mental Status: Normal mood and affect. Normal behavior. Normal judgment and thought content.  U/S 3/21 Normal amniotic fluid volume EV views of cervix: shortened cervical length of 2.1 - 2.3 cms without funneling The US findings were shared with Ms. Kugel. With  her history of a 28 week delivery and a CL <2.5 cms, she is a candidate for a cerclage.  Assessment and Plan:  Pregnancy: [id number] at [redacted]w[redacted]d  1. History of preterm delivery, currently pregnant in second trimester Begin 17 P  For cerclage based on MFM recs with short cervix. - hydroxyprogesterone caproate (MAKENA) 250 mg/mL injection 250 mg; Inject 1 mL (250 mg total) into the muscle once a week.   2. Supervision of high risk pregnancy in second trimester - AFP, Serum, Open Spina Bifida - HIV antibody   3. Short cervical length during pregnancy in second trimester For cerclage. Risk of anesthesia, preterm delivery, vaginal bleeding, contractions, leaking of fluid, infection, pain were discussed and her questions were answered  4. Bacterial vaginosis- recently treated - Cervicovaginal ancillary only - wet prep - metroNIDAZOLE (FLAGYL) 500 MG tablet; Take 1 tablet (500 mg total) by mouth 2 (two) times daily.  Dispense: 14 tablet; Refill: 0  Preterm labor symptoms and general obstetric precautions including but not limited to vaginal bleeding, contractions, leaking of fluid were reviewed in detail with the patient.  Adam Phenix, MD 12/31/2016 11:39 AM

## 2017-01-01 ENCOUNTER — Encounter (HOSPITAL_COMMUNITY): Payer: Self-pay | Admitting: Obstetrics & Gynecology

## 2017-01-01 NOTE — Anesthesia Postprocedure Evaluation (Signed)
Anesthesia Post Note  Patient: Beth Giles  Procedure(s) Performed: Procedure(s) (LRB): CERCLAGE CERVICAL (N/A)  Patient location during evaluation: PACU Anesthesia Type: Spinal Level of consciousness: awake and alert Pain management: pain level controlled Vital Signs Assessment: post-procedure vital signs reviewed and stable Respiratory status: spontaneous breathing and respiratory function stable Cardiovascular status: blood pressure returned to baseline and stable Postop Assessment: spinal receding Anesthetic complications: no        Last Vitals:  Vitals:   12/31/16 1430 12/31/16 1440  BP: 115/70   Pulse: 93 89  Resp: 20 (!) 21  Temp:      Last Pain:  Vitals:   12/31/16 1115  TempSrc: Oral   Pain Goal: Patients Stated Pain Goal: 3 (12/31/16 1115)               Lewie Loron

## 2017-01-01 NOTE — Telephone Encounter (Signed)
[facility] called to schedule delivery for patients makena. It will be delivered sometime tomorrow. Called patient and informed her of this info and she will come on Thursday at 745 for her Makena injection.

## 2017-01-02 ENCOUNTER — Ambulatory Visit: Payer: Self-pay

## 2017-01-03 ENCOUNTER — Ambulatory Visit: Payer: BLUE CROSS/BLUE SHIELD

## 2017-01-03 VITALS — BP 117/72

## 2017-01-03 DIAGNOSIS — O09219 Supervision of pregnancy with history of pre-term labor, unspecified trimester: Principal | ICD-10-CM

## 2017-01-03 DIAGNOSIS — O09899 Supervision of other high risk pregnancies, unspecified trimester: Secondary | ICD-10-CM

## 2017-01-03 NOTE — Progress Notes (Signed)
Patient presented to office today for her 17-p injection. Patient tolerated well and will follow up next week for her next one.

## 2017-01-08 ENCOUNTER — Other Ambulatory Visit (HOSPITAL_COMMUNITY): Payer: Self-pay | Admitting: *Deleted

## 2017-01-08 ENCOUNTER — Encounter (HOSPITAL_COMMUNITY): Payer: Self-pay

## 2017-01-08 ENCOUNTER — Other Ambulatory Visit: Payer: Self-pay | Admitting: Obstetrics & Gynecology

## 2017-01-08 ENCOUNTER — Ambulatory Visit (HOSPITAL_COMMUNITY)
Admission: RE | Admit: 2017-01-08 | Discharge: 2017-01-08 | Disposition: A | Discharge status: Home / Self Care | Payer: BLUE CROSS/BLUE SHIELD | Source: Ambulatory Visit | Attending: Obstetrics & Gynecology | Admitting: Obstetrics & Gynecology

## 2017-01-08 DIAGNOSIS — O26872 Cervical shortening, second trimester: Secondary | ICD-10-CM

## 2017-01-08 DIAGNOSIS — O0992 Supervision of high risk pregnancy, unspecified, second trimester: Secondary | ICD-10-CM | POA: Insufficient documentation

## 2017-01-08 DIAGNOSIS — O09219 Supervision of pregnancy with history of pre-term labor, unspecified trimester: Secondary | ICD-10-CM | POA: Diagnosis not present

## 2017-01-08 DIAGNOSIS — Z3A18 18 weeks gestation of pregnancy: Secondary | ICD-10-CM | POA: Insufficient documentation

## 2017-01-08 DIAGNOSIS — Z3A19 19 weeks gestation of pregnancy: Secondary | ICD-10-CM | POA: Diagnosis not present

## 2017-01-08 DIAGNOSIS — Z8751 Personal history of pre-term labor: Secondary | ICD-10-CM

## 2017-01-08 DIAGNOSIS — O09212 Supervision of pregnancy with history of pre-term labor, second trimester: Secondary | ICD-10-CM | POA: Diagnosis not present

## 2017-01-08 DIAGNOSIS — Z3689 Encounter for other specified antenatal screening: Secondary | ICD-10-CM

## 2017-01-08 DIAGNOSIS — O09899 Supervision of other high risk pregnancies, unspecified trimester: Secondary | ICD-10-CM

## 2017-01-08 DIAGNOSIS — O99331 Smoking (tobacco) complicating pregnancy, first trimester: Secondary | ICD-10-CM

## 2017-01-09 ENCOUNTER — Ambulatory Visit (INDEPENDENT_AMBULATORY_CARE_PROVIDER_SITE_OTHER): Payer: BLUE CROSS/BLUE SHIELD | Admitting: Obstetrics & Gynecology

## 2017-01-09 VITALS — BP 114/67 | HR 72 | Wt 128.6 lb

## 2017-01-09 DIAGNOSIS — O09212 Supervision of pregnancy with history of pre-term labor, second trimester: Secondary | ICD-10-CM | POA: Diagnosis not present

## 2017-01-09 DIAGNOSIS — O09899 Supervision of other high risk pregnancies, unspecified trimester: Secondary | ICD-10-CM

## 2017-01-09 DIAGNOSIS — Z0489 Encounter for examination and observation for other specified reasons: Secondary | ICD-10-CM

## 2017-01-09 DIAGNOSIS — IMO0002 Reserved for concepts with insufficient information to code with codable children: Secondary | ICD-10-CM

## 2017-01-09 DIAGNOSIS — O26872 Cervical shortening, second trimester: Secondary | ICD-10-CM

## 2017-01-09 DIAGNOSIS — Z362 Encounter for other antenatal screening follow-up: Secondary | ICD-10-CM

## 2017-01-09 DIAGNOSIS — O09219 Supervision of pregnancy with history of pre-term labor, unspecified trimester: Principal | ICD-10-CM

## 2017-01-09 DIAGNOSIS — O0992 Supervision of high risk pregnancy, unspecified, second trimester: Secondary | ICD-10-CM

## 2017-01-09 MED ORDER — PROGESTERONE MICRONIZED 200 MG PO CAPS: ORAL_CAPSULE | ORAL | 3 refills | Status: DC

## 2017-01-09 NOTE — Progress Notes (Signed)
PRENATAL VISIT NOTE  Subjective:  Beth Giles is a 31 y.o. [phone] at [redacted]w[redacted]d being seen today for ongoing prenatal care.  She is currently monitored for the following issues for this high-risk pregnancy and has Supervision of high-risk pregnancy; History of preterm delivery, currently pregnant; Tobacco smoking affecting pregnancy in first trimester; Anxiety during pregnancy, antepartum; and Short cervical length during pregnancy in second trimester on her problem list.  Patient reports no complaints.   .  .  Movement: Present. Denies leaking of fluid.   The following portions of the patient's history were reviewed and updated as appropriate: allergies, current medications, past family history, past medical history, past social history, past surgical history and problem list. Problem list updated.  Objective:   Vitals:   01/09/17 1549  BP: 114/67  Pulse: 72  Weight: 128 lb 9.6 oz (58.3 kg)    Fetal Status: Fetal Heart Rate (bpm): 130   Movement: Present     General:  Alert, oriented and cooperative. Patient is in no acute distress.  Skin: Skin is warm and dry. No rash noted.   Cardiovascular: Normal heart rate noted  Respiratory: Normal respiratory effort, no problems with respiration noted  Abdomen: Soft, gravid, appropriate for gestational age. Pain/Pressure: Absent     Pelvic:  Cervical exam deferred        Extremities: Normal range of motion.  Edema: None  Mental Status: Normal mood and affect. Normal behavior. Normal judgment and thought content.   [place] Mfm Ob Transvaginal  Result Date: 01/08/2017 ----------------------------------------------------------------------  OBSTETRICS REPORT                      (Signed Final 01/08/2017 03:45 pm) ---------------------------------------------------------------------- Patient Info  ID #:       [id number]                         D.O.B.:   March 02, 1986 (30 yrs)  Name:       Beth Giles            Visit Date:  01/08/2017 02:39 pm  ---------------------------------------------------------------------- Performed By  Performed By:     Beth Giles Phy.:   [facility] for                                                             [facility]                                                             Healthcare  Attending:  Erle Crocker MD     Address:          [facility]                                                             [address]                                                             [place], [place]                                                             [address]  Referred By:      Brand Males                Location:         [facility] NP  Ref. Address:     [address]                    [place] ---------------------------------------------------------------------- Orders   #  Description                                 Code   1  [place] MFM OB COMP + 14 WK                      [id number]   2  [place] MFM OB TRANSVAGINAL                      [id number]  ----------------------------------------------------------------------   #  Ordered By               Order #        Accession #    Episode #   1  Beth Giles           [id number]      [id number]  [id number]   2  Beth Giles           [id number]      [id number]     [id number]  ---------------------------------------------------------------------- Indications   [redacted] weeks gestation of pregnancy                Z3A.18   Poor obstetric history (prior pre-term labor)  O09.219   (28 weeks)   Encounter for antenatal screening for          Z36.0   chromosomal anomalies   Cervical shortening, second trimester s/p      O26.872   cerclage   ---------------------------------------------------------------------- OB History  Blood Type:            Height:         Weight (lb):  128      BMI:  Gravidity:    3         Term:   0        Prem:   1        SAB:   0  TOP:          1       Ectopic:  0        Living: 1 ---------------------------------------------------------------------- Fetal Evaluation  Num Of Fetuses:     1  Fetal Heart         143  Rate(bpm):  Cardiac Activity:   Observed  Presentation:       Breech  Placenta:           Posterior, above cervical os  P. Cord Insertion:  Visualized, central  Amniotic Fluid  AFI FV:      Subjectively within normal limits                              Largest Pocket(cm)                              3.6 ---------------------------------------------------------------------- Biometry  BPD:      35.5  mm     G. Age:  16w 6d          3  %    CI:        63.36   %   70 - 86                                                          FL/HC:      17.1   %   15.8 - 18  HC:      143.7  mm     G. Age:  17w 4d         10  %    HC/AC:      1.15       1.07 - 1.29  AC:      125.3  mm     G. Age:  18w 1d         38  %    FL/BPD:     69.3   %  FL:       24.6  mm     G. Age:  17w 3d         13  %  FL/AC:      19.6   %   20 - 24  HUM:      22.8  mm     G. Age:  17w 0d         11  %  Est. FW:     208  gm      0 lb 7 oz     33  % ---------------------------------------------------------------------- Gestational Age  LMP:           18w 3d       Date:   09/01/16                 EDD:   06/08/17  U/S Today:     17w 4d                                        EDD:   06/14/17  Best:          18w 3d    Det. By:   LMP  (09/01/16)          EDD:   06/08/17 ---------------------------------------------------------------------- Anatomy  Cranium:               Appears normal         LVOT:                   Appears normal  Cavum:                 Appears normal         Aortic Arch:            Not well visualized  Ventricles:            Appears normal          Ductal Arch:            Not well visualized  Choroid Plexus:        Appears normal         Diaphragm:              Appears normal  Cerebellum:            Appears normal         Stomach:                Appears normal, left                                                                        sided  Posterior Fossa:       Appears normal         Abdomen:                Appears normal  Nuchal Fold:           Appears normal         Abdominal Wall:         Appears nml (cord  insert, abd wall)  Face:                  Appears normal         Cord Vessels:           Appears normal (3                         (orbits and profile)                           vessel cord)  Lips:                  Appears normal         Kidneys:                Appear normal  Palate:                Appears normal         Bladder:                Appears normal  Thoracic:              Appears normal         Spine:                  Appears normal  Heart:                 Appears normal         Upper Extremities:      Appears normal                         (4CH, axis, and                         situs)  RVOT:                  Appears normal         Lower Extremities:      Appears normal  Other:  Fetus appears to be a female. 5th digit visualized. ---------------------------------------------------------------------- Cervix Uterus Adnexa  Cervix  Length:            2.2  cm.  Measured transvaginally. Cerclage visualized.  Uterus  No abnormality visualized.  Left Ovary  No adnexal mass visualized.  Right Ovary  No adnexal mass visualized.  Cul De Sac:   No free fluid seen.  Adnexa:       No abnormality visualized. ---------------------------------------------------------------------- Impression  Indication: 31 yr old G3P0111 at [redacted]w[redacted]d with history of  preterm delivery and short cervix s/p cerclage for fetal  anatomic survey and cervical length.  Findings:  1. Single intrauterine  pregnancy.  2. Fetal biometry is consistent with dating.  3. Posterior placenta without evidence of previa.  4. Normal amniotic fluid volume.  5. Shortened transvaginal cervical length to 2.2cm; the  cerclage is visualized. No funneling is seen.  6. The views of the ductal and aortic arches are limited.  7. The remainder of the limited anatomy survey is normal. ---------------------------------------------------------------------- Recommendations  1. Appropriate fetal growth.  2. Limited anatomy survey:  - reevaluate fetal anatomy on follow up ultrasound  3. History of preterm delivery; now with short cervix:  - s/p cerclage  - on makena  - recommend continue cervical length surveillance until 28  weeks  - if  further shortening occurs recommend add vaginal  progesterone  - recommend preterm labor precautions  4. Normal first trimester screen and MSAFP ----------------------------------------------------------------------                Beth Crocker, MD Electronically Signed Final Report   01/08/2017 03:45 pm ----------------------------------------------------------------------  [place] Mfm Ob Transvaginal  Result Date: 12/20/2016 ----------------------------------------------------------------------  OBSTETRICS REPORT                      (Signed Final 12/20/2016 08:44 am) ---------------------------------------------------------------------- Patient Info  ID #:       [id number]                         D.O.B.:   02-22-1986 (30 yrs)  Name:       Maryln Manuel                  Visit Date:  12/19/2016 02:41 pm ---------------------------------------------------------------------- Performed By  Performed By:     Earley Brooke     Secondary Phy.:   [facility]                                                             [facility]  Attending:         Particia Nearing MD       Address:          [facility]                                                             [address]                                                             [place], [place]                                                             [address]  Referred By:      Brand Males                Location:         [facility] NP  Ref. Address:     [address]                    [place] ---------------------------------------------------------------------- Orders   #  Description                                 Code   1  [place] MFM OB TRANSVAGINAL                      69629.5  ----------------------------------------------------------------------   #  Ordered By               Order #        Accession #    Episode #   1  Beth Giles           [id number]      [id number]     [id number]  ---------------------------------------------------------------------- Indications   [redacted] weeks gestation of pregnancy                Z3A.15   Poor obstetric history (prior pre-term labor)  O09.219   (28 weeks)   Encounter for cervical length                  Z36.86  ---------------------------------------------------------------------- OB History  Blood Type:            Height:         Weight (lb):  128      BMI:  Gravidity:    3         Term:   0        Prem:   1        SAB:   0  TOP:          1       Ectopic:  0        Living: 1 ---------------------------------------------------------------------- Fetal Evaluation  Num Of Fetuses:     1  Fetal Heart         138  Rate(bpm):  Cardiac Activity:   Observed  Presentation:       Cephalic  Placenta:           Posterior, above cervical os  Amniotic Fluid  AFI  FV:      Subjectively within normal limits ----------------------------------------------------------------------  Gestational Age  LMP:           15w 4d       Date:   09/01/16                 EDD:   06/08/17  Best:          15w 4d    Det. By:   LMP  (09/01/16)          EDD:   06/08/17 ---------------------------------------------------------------------- Cervix Uterus Adnexa  Cervix  Length:            2.1  cm.  Measured transvaginally. 2.1 cm with Valsalva maneuver ---------------------------------------------------------------------- Impression  SIUP at 15+4 weeks  Normal amniotic fluid volume  EV views of cervix: shortened cervical length of 2.1 - 2.3 cms  without funneling  The US findings were shared with Ms. Marland. With her  history of a 28 week delivery and a CL < 2.5 cms, she is a  candidate for a cerclage. I briefly reviewed the risks and  benefits of cerclage vs serial CLs. She was interested in  pursuing cerclage placement. Discussed case with Dr. Debroah Loop. ---------------------------------------------------------------------- Recommendations  Keep appt for anatomic survey and CL in 2 weeks ----------------------------------------------------------------------                 Particia Nearing, MD Electronically Signed Final Report   12/20/2016 08:44 am ----------------------------------------------------------------------  [place] Mfm Ob Comp + 14 Wk  Result Date: 01/08/2017 ----------------------------------------------------------------------  OBSTETRICS REPORT                      (Signed Final 01/08/2017 03:45 pm) ---------------------------------------------------------------------- Patient Info  ID #:       [id number]                         D.O.B.:   [birth year] (30 yrs)  Name:       ELLOISE ROARK [facility]            Visit Date:  01/08/2017 02:39 pm ---------------------------------------------------------------------- Performed By  Performed By:     Beth Giles Phy.:   [facility] for                                                              [facility]                                                             Healthcare  Attending:  Erle Crocker MD     Address:          [facility]                                                             [address]                                                             [place], [place]                                                             [address]  Referred By:      Brand Males                Location:         [facility] NP  Ref. Address:     [address]                    [place] ---------------------------------------------------------------------- Orders   #  Description                                 Code   1  [place] MFM OB COMP + 14 WK                      [id number]   2  [place] MFM OB TRANSVAGINAL                      [id number]  ----------------------------------------------------------------------   #  Ordered By               Order #        Accession #    Episode #   1  Beth Giles           [id number]      [id number]  [id number]   2  Beth Giles           [id number]      [id number]     [id number]  ---------------------------------------------------------------------- Indications   [redacted] weeks gestation of pregnancy                Z3A.18   Poor obstetric history (prior pre-term labor)  O09.219   (28 weeks)   Encounter for antenatal screening for          Z36.0   chromosomal anomalies   Cervical shortening, second trimester s/p      O26.872   cerclage  ---------------------------------------------------------------------- OB History  Blood Type:            Height:         Weight (lb):  128      BMI:  Gravidity:    3         Term:   0        Prem:   1        SAB:   0  TOP:          1       Ectopic:  0        Living: 1  ---------------------------------------------------------------------- Fetal Evaluation  Num Of Fetuses:     1  Fetal Heart         143  Rate(bpm):  Cardiac Activity:   Observed  Presentation:       Breech  Placenta:           Posterior, above cervical os  P. Cord Insertion:  Visualized, central  Amniotic Fluid  AFI FV:      Subjectively within normal limits                              Largest Pocket(cm)                              3.6 ---------------------------------------------------------------------- Biometry  BPD:      35.5  mm     G. Age:  16w 6d          3  %    CI:        63.36   %   70 - 86                                                          FL/HC:      17.1   %   15.8 - 18  HC:      143.7  mm     G. Age:  17w 4d         10  %    HC/AC:      1.15       1.07 - 1.29  AC:      125.3  mm     G. Age:  18w 1d         38  %    FL/BPD:     69.3   %  FL:       24.6  mm     G. Age:  17w 3d         13  %  FL/AC:      19.6   %   20 - 24  HUM:      22.8  mm     G. Age:  17w 0d         11  %  Est. FW:     208  gm      0 lb 7 oz     33  % ---------------------------------------------------------------------- Gestational Age  LMP:           18w 3d       Date:   09/01/16                 EDD:   06/08/17  U/S Today:     17w 4d                                        EDD:   06/14/17  Best:          18w 3d    Det. By:   LMP  (09/01/16)          EDD:   06/08/17 ---------------------------------------------------------------------- Anatomy  Cranium:               Appears normal         LVOT:                   Appears normal  Cavum:                 Appears normal         Aortic Arch:            Not well visualized  Ventricles:            Appears normal         Ductal Arch:            Not well visualized  Choroid Plexus:        Appears normal         Diaphragm:              Appears normal  Cerebellum:            Appears normal         Stomach:                Appears normal, left                                                                         sided  Posterior Fossa:       Appears normal         Abdomen:                Appears normal  Nuchal Fold:           Appears normal         Abdominal Wall:         Appears nml (cord  insert, abd wall)  Face:                  Appears normal         Cord Vessels:           Appears normal (3                         (orbits and profile)                           vessel cord)  Lips:                  Appears normal         Kidneys:                Appear normal  Palate:                Appears normal         Bladder:                Appears normal  Thoracic:              Appears normal         Spine:                  Appears normal  Heart:                 Appears normal         Upper Extremities:      Appears normal                         (4CH, axis, and                         situs)  RVOT:                  Appears normal         Lower Extremities:      Appears normal  Other:  Fetus appears to be a female. 5th digit visualized. ---------------------------------------------------------------------- Cervix Uterus Adnexa  Cervix  Length:            2.2  cm.  Measured transvaginally. Cerclage visualized.  Uterus  No abnormality visualized.  Left Ovary  No adnexal mass visualized.  Right Ovary  No adnexal mass visualized.  Cul De Sac:   No free fluid seen.  Adnexa:       No abnormality visualized. ---------------------------------------------------------------------- Impression  Indication: 31 yr old G3P0111 at [redacted]w[redacted]d with history of  preterm delivery and short cervix s/p cerclage for fetal  anatomic survey and cervical length.  Findings:  1. Single intrauterine pregnancy.  2. Fetal biometry is consistent with dating.  3. Posterior placenta without evidence of previa.  4. Normal amniotic fluid volume.  5. Shortened transvaginal cervical length to 2.2cm; the  cerclage is visualized. No funneling is seen.  6. The views of the ductal and  aortic arches are limited.  7. The remainder of the limited anatomy survey is normal. ---------------------------------------------------------------------- Recommendations  1. Appropriate fetal growth.  2. Limited anatomy survey:  - reevaluate fetal anatomy on follow up ultrasound  3. History of preterm delivery; now with short cervix:  - s/p cerclage  - on makena  - recommend continue cervical length surveillance until 28  weeks  - if  further shortening occurs recommend add vaginal  progesterone  - recommend preterm labor precautions  4. Normal first trimester screen and MSAFP ----------------------------------------------------------------------                Beth Crocker, MD Electronically Signed Final Report   01/08/2017 03:45 pm ----------------------------------------------------------------------   Assessment and Plan:  Pregnancy: [id number] at [redacted]w[redacted]d 1. History of preterm delivery, currently pregnant Continue 17P weekly.   2. Short cervical length during pregnancy in second trimester Given cervical length of 2.2 cm, will proactively start vaginal progesterone. Patient agrees to plan. Will follow up other cervical length scans.  Cerclage is in place.  - progesterone (PROMETRIUM) 200 MG capsule; Place one capsule vaginally at bedtime  Dispense: 30 capsule; Refill: 3  3. Evaluate anatomy not seen on prior sonogram Ductal/aortic arches not seen, follow up in one month. - [place] MFM OB FOLLOW UP; Future  4. Supervision of high risk pregnancy in second trimester Preterm labor symptoms and general obstetric precautions including but not limited to vaginal bleeding, contractions, leaking of fluid and fetal movement were reviewed in detail with the patient. Please refer to After Visit Summary for other counseling recommendations.  Return in about 1 week (around 01/16/2017) for 17P. 2 weeks 17P and OB vist (HOB).   Tereso Newcomer, MD

## 2017-01-09 NOTE — Patient Instructions (Signed)
Return to clinic for any scheduled appointments or obstetric concerns, or go to MAU for evaluation   AREA PEDIATRIC/FAMILY PRACTICE PHYSICIANS  [facility] [address], [address] [place], [place]  [address] Phone - [phone]   Fax - [phone]  [facility] [address] [place], [place] [address] Phone - [phone]   Fax - [phone]  [facility] [address] [place], [place]  [address] Phone - [phone]   Fax - [phone]  [facility] [address] [place], [place]  [address] Phone - [phone]   Fax - [phone]  [facility] [address] [place], [place]  [address] Phone - [phone]   Fax - [phone]  [facility] [address], [address] [place], [place]  [address] Phone - [phone]   Fax - [phone]  [facility] [address] [place], [place]  [address] Phone - [phone]   Fax - [phone]  [facility] [address], [address] [place], [place]  [address] Phone - [phone]   Fax - [phone]  [facility] [address] [place], [place]  [address] Phone - [phone]   Fax - [phone] [facility] [address] [place], [place]  [address] Phone - [phone]   Fax - [phone]  [facility] [address] [place], [place]  [address] Phone - [phone]   Fax - [phone]  [facility] [address] [place], [place]  [address] Phone - [phone]   Fax - [phone]  [facility] [address], [address] [place], [place]  [address] Phone - [phone]   Fax - [phone]  SHILPA GOSRANI [address] [place], [place]  [address] Phone - [phone]  [facility] [address] [place], [place]  [address] Phone - [phone]   Fax - [phone]  [facility] [address] [place], [place]  [address] Phone - [phone]   Fax - [phone]  [facility] [address] [place], [place]  [address] Phone - [phone]   Fax - [phone]  [facility] [address] [place], [place]  [address] Phone - [phone]   Fax - [phone]   [facility] [address] [place], [place]  [address] Phone - [phone]   Fax - [phone]  [facility] [address] [place], [place]  [address] Phone - [phone]   Fax - [phone]  [facility] R[facility] [address] [place], [place]  [address] Phone - [phone]   Fax - [phone]  [facility] [address] [place], [place]  [address] Phone - [phone]   Fax - [phone]  [facility] [address] , [place]  [address] Phone - [phone]   Fax - [phone] [facility] [address] [place], [place]  [address] Phone - [phone]   Fax - [phone]  [facility] [address] [place], [place]  [address] Phone - [phone]   Fax - [phone]  [facility] [address] [place], [place]  [address] Phone - [phone]   Fax - [phone]  [facility] Charlene Flemming MD [address] [place] [address] Phone [phone]    Fax [phone]   

## 2017-01-16 ENCOUNTER — Ambulatory Visit (INDEPENDENT_AMBULATORY_CARE_PROVIDER_SITE_OTHER): Payer: BLUE CROSS/BLUE SHIELD | Admitting: *Deleted

## 2017-01-16 VITALS — BP 118/63 | HR 76 | Wt 131.5 lb

## 2017-01-16 DIAGNOSIS — O09212 Supervision of pregnancy with history of pre-term labor, second trimester: Secondary | ICD-10-CM | POA: Diagnosis not present

## 2017-01-16 DIAGNOSIS — O09219 Supervision of pregnancy with history of pre-term labor, unspecified trimester: Principal | ICD-10-CM

## 2017-01-16 DIAGNOSIS — O09899 Supervision of other high risk pregnancies, unspecified trimester: Secondary | ICD-10-CM

## 2017-01-23 ENCOUNTER — Ambulatory Visit (INDEPENDENT_AMBULATORY_CARE_PROVIDER_SITE_OTHER): Payer: BLUE CROSS/BLUE SHIELD | Admitting: Obstetrics & Gynecology

## 2017-01-23 ENCOUNTER — Ambulatory Visit (HOSPITAL_COMMUNITY)
Admission: RE | Admit: 2017-01-23 | Discharge: 2017-01-23 | Disposition: A | Discharge status: Home / Self Care | Payer: BLUE CROSS/BLUE SHIELD | Source: Ambulatory Visit | Attending: Obstetrics & Gynecology | Admitting: Obstetrics & Gynecology

## 2017-01-23 ENCOUNTER — Other Ambulatory Visit (HOSPITAL_COMMUNITY)
Admission: RE | Admit: 2017-01-23 | Discharge: 2017-01-23 | Disposition: A | Discharge status: Home / Self Care | Payer: BLUE CROSS/BLUE SHIELD | Source: Ambulatory Visit | Attending: Obstetrics & Gynecology | Admitting: Obstetrics & Gynecology

## 2017-01-23 ENCOUNTER — Encounter (HOSPITAL_COMMUNITY): Payer: Self-pay

## 2017-01-23 VITALS — BP 122/65 | HR 73 | Wt 130.4 lb

## 2017-01-23 DIAGNOSIS — O26892 Other specified pregnancy related conditions, second trimester: Secondary | ICD-10-CM

## 2017-01-23 DIAGNOSIS — Z113 Encounter for screening for infections with a predominantly sexual mode of transmission: Secondary | ICD-10-CM | POA: Diagnosis not present

## 2017-01-23 DIAGNOSIS — Z8751 Personal history of pre-term labor: Secondary | ICD-10-CM

## 2017-01-23 DIAGNOSIS — N894 Leukoplakia of vagina: Secondary | ICD-10-CM | POA: Insufficient documentation

## 2017-01-23 DIAGNOSIS — N898 Other specified noninflammatory disorders of vagina: Secondary | ICD-10-CM | POA: Diagnosis not present

## 2017-01-23 DIAGNOSIS — O09212 Supervision of pregnancy with history of pre-term labor, second trimester: Secondary | ICD-10-CM

## 2017-01-23 DIAGNOSIS — Z3A20 20 weeks gestation of pregnancy: Secondary | ICD-10-CM | POA: Insufficient documentation

## 2017-01-23 DIAGNOSIS — O0992 Supervision of high risk pregnancy, unspecified, second trimester: Secondary | ICD-10-CM

## 2017-01-23 DIAGNOSIS — O26872 Cervical shortening, second trimester: Secondary | ICD-10-CM

## 2017-01-23 DIAGNOSIS — O09219 Supervision of pregnancy with history of pre-term labor, unspecified trimester: Secondary | ICD-10-CM

## 2017-01-23 DIAGNOSIS — O99342 Other mental disorders complicating pregnancy, second trimester: Secondary | ICD-10-CM

## 2017-01-23 DIAGNOSIS — O09899 Supervision of other high risk pregnancies, unspecified trimester: Secondary | ICD-10-CM

## 2017-01-23 DIAGNOSIS — F419 Anxiety disorder, unspecified: Secondary | ICD-10-CM

## 2017-01-23 DIAGNOSIS — O99340 Other mental disorders complicating pregnancy, unspecified trimester: Secondary | ICD-10-CM

## 2017-01-23 DIAGNOSIS — O3432 Maternal care for cervical incompetence, second trimester: Secondary | ICD-10-CM | POA: Diagnosis not present

## 2017-01-23 MED ORDER — BUSPIRONE HCL 10 MG PO TABS: 10.0000 mg | ORAL_TABLET | Freq: Three times a day (TID) | ORAL | 3 refills | Status: DC

## 2017-01-23 NOTE — Progress Notes (Signed)
   PRENATAL VISIT NOTE  Subjective:  Beth Giles is a 31 y.o. [phone] at [redacted]w[redacted]d being seen today for ongoing prenatal care.  She is currently monitored for the following issues for this low-risk pregnancy and has Supervision of high-risk pregnancy; History of preterm delivery, currently pregnant; Tobacco smoking affecting pregnancy in first trimester; Anxiety during pregnancy, antepartum; and Short cervical length during pregnancy in second trimester on her problem list.  Patient reports abnormal vaginal discharge with mild odor.  Also noticed red discharge after using vaginal progesterone; the medication has red capsule coverings.  Contractions: Not present. Vag. Bleeding: None.  Movement: Present. Denies leaking of fluid.   The following portions of the patient's history were reviewed and updated as appropriate: allergies, current medications, past family history, past medical history, past social history, past surgical history and problem list. Problem list updated.  Objective:   Vitals:   01/23/17 1406  BP: 122/65  Pulse: 73  Weight: 130 lb 6.4 oz (59.1 kg)    Fetal Status: Fetal Heart Rate (bpm): 141   Movement: Present     General:  Alert, oriented and cooperative. Patient is in no acute distress.  Skin: Skin is warm and dry. No rash noted.   Cardiovascular: Normal heart rate noted  Respiratory: Normal respiratory effort, no problems with respiration noted  Abdomen: Soft, gravid, appropriate for gestational age. Pain/Pressure: Absent     Pelvic:  Cervical exam performed Dilation: Closed Effacement (%): Thick Station: Ballotable. Normal appearing vaginal discharge, testing sample obtained.  Extremities: Normal range of motion.  Edema: None  Mental Status: Normal mood and affect. Normal behavior. Normal judgment and thought content.   Assessment and Plan:  Pregnancy: [id number] at [redacted]w[redacted]d  1. Short cervical length during pregnancy in second trimester 2. History of preterm  delivery, currently pregnant Continue weekly 17P. Encouraged to continue vaginal progesterone and to call pharmacy to see if there is another formulation with clear/white capsules so as not to have red discharge. Cervical length scan today and subsequent scans scheduled as per MFM.  3. Vaginal discharge during pregnancy in second trimester Normal appearing, but will follow up test results. - Cervicovaginal ancillary only  4. Anxiety during pregnancy, antepartum Reports Vistaril makes her too sleepy. Buspar prescribed, will follow up response. - busPIRone (BUSPAR) 10 MG tablet; Take 1 tablet (10 mg total) by mouth 3 (three) times daily.  Dispense: 90 tablet; Refill: 3  5. Supervision of high risk pregnancy in second trimester Preterm labor symptoms and general obstetric precautions including but not limited to vaginal bleeding, contractions, leaking of fluid and fetal movement were reviewed in detail with the patient. Please refer to After Visit Summary for other counseling recommendations.  Return in about 1 week (around 01/30/2017) for 17P  2 weeks: OB visit, 17P.   Tereso Newcomer, MD

## 2017-01-23 NOTE — Patient Instructions (Signed)
Return to clinic for any scheduled appointments or obstetric concerns, or go to MAU for evaluation  

## 2017-01-24 LAB — CERVICOVAGINAL ANCILLARY ONLY
BACTERIAL VAGINITIS: NEGATIVE
CANDIDA VAGINITIS: NEGATIVE
Chlamydia: NEGATIVE
Neisseria Gonorrhea: NEGATIVE
TRICH (WINDOWPATH): NEGATIVE

## 2017-01-30 ENCOUNTER — Encounter: Payer: Self-pay | Admitting: Family Medicine

## 2017-01-30 ENCOUNTER — Ambulatory Visit (INDEPENDENT_AMBULATORY_CARE_PROVIDER_SITE_OTHER): Payer: BLUE CROSS/BLUE SHIELD | Admitting: *Deleted

## 2017-01-30 VITALS — BP 120/62 | HR 79 | Wt 132.8 lb

## 2017-01-30 DIAGNOSIS — O09219 Supervision of pregnancy with history of pre-term labor, unspecified trimester: Principal | ICD-10-CM

## 2017-01-30 DIAGNOSIS — O09212 Supervision of pregnancy with history of pre-term labor, second trimester: Secondary | ICD-10-CM | POA: Diagnosis not present

## 2017-01-30 DIAGNOSIS — O09899 Supervision of other high risk pregnancies, unspecified trimester: Secondary | ICD-10-CM

## 2017-01-30 NOTE — Progress Notes (Signed)
17P administered as scheduled.  Pt tolerated well.  

## 2017-02-06 ENCOUNTER — Encounter (HOSPITAL_COMMUNITY): Payer: Self-pay

## 2017-02-06 ENCOUNTER — Encounter: Payer: Self-pay | Admitting: Obstetrics and Gynecology

## 2017-02-06 ENCOUNTER — Ambulatory Visit (INDEPENDENT_AMBULATORY_CARE_PROVIDER_SITE_OTHER): Payer: BLUE CROSS/BLUE SHIELD | Admitting: Obstetrics and Gynecology

## 2017-02-06 ENCOUNTER — Ambulatory Visit (HOSPITAL_COMMUNITY)
Admission: RE | Admit: 2017-02-06 | Discharge: 2017-02-06 | Disposition: A | Discharge status: Home / Self Care | Payer: BLUE CROSS/BLUE SHIELD | Source: Ambulatory Visit | Attending: Obstetrics & Gynecology | Admitting: Obstetrics & Gynecology

## 2017-02-06 VITALS — BP 122/60 | HR 78 | Wt 131.5 lb

## 2017-02-06 DIAGNOSIS — Z3A22 22 weeks gestation of pregnancy: Secondary | ICD-10-CM | POA: Diagnosis not present

## 2017-02-06 DIAGNOSIS — O26872 Cervical shortening, second trimester: Secondary | ICD-10-CM

## 2017-02-06 DIAGNOSIS — Z0489 Encounter for examination and observation for other specified reasons: Secondary | ICD-10-CM

## 2017-02-06 DIAGNOSIS — O3432 Maternal care for cervical incompetence, second trimester: Secondary | ICD-10-CM | POA: Diagnosis not present

## 2017-02-06 DIAGNOSIS — Z3686 Encounter for antenatal screening for cervical length: Secondary | ICD-10-CM | POA: Diagnosis not present

## 2017-02-06 DIAGNOSIS — O99332 Smoking (tobacco) complicating pregnancy, second trimester: Secondary | ICD-10-CM

## 2017-02-06 DIAGNOSIS — O09212 Supervision of pregnancy with history of pre-term labor, second trimester: Secondary | ICD-10-CM | POA: Diagnosis not present

## 2017-02-06 DIAGNOSIS — O99331 Smoking (tobacco) complicating pregnancy, first trimester: Secondary | ICD-10-CM

## 2017-02-06 DIAGNOSIS — Z048 Encounter for examination and observation for other specified reasons: Secondary | ICD-10-CM | POA: Diagnosis present

## 2017-02-06 DIAGNOSIS — Z3689 Encounter for other specified antenatal screening: Secondary | ICD-10-CM | POA: Diagnosis not present

## 2017-02-06 DIAGNOSIS — IMO0002 Reserved for concepts with insufficient information to code with codable children: Secondary | ICD-10-CM

## 2017-02-06 DIAGNOSIS — O0992 Supervision of high risk pregnancy, unspecified, second trimester: Secondary | ICD-10-CM

## 2017-02-06 DIAGNOSIS — Z8751 Personal history of pre-term labor: Secondary | ICD-10-CM | POA: Diagnosis present

## 2017-02-06 NOTE — Patient Instructions (Signed)
Pelvic Rest Based on your overall health and the health of your baby, your health care provider will decide if pelvic rest is right for you. How do I rest my pelvis? For as long as told by your health care provider:  Do not have sex, sexual stimulation, or an orgasm.  Do not use tampons. Do not douche. Do not put anything in your vagina.  Do not lift anything that is heavier than 10 lb (4.5 kg).  Avoid activities that take a lot of effort (are strenuous).  Avoid any activity in which your pelvic muscles could become strained. When should I seek medical care? Seek medical care if you have:  Cramping pain in your lower abdomen.  Vaginal discharge.  A low, dull backache.  Regular contractions.  Uterine tightening. When should I seek immediate medical care? Seek immediate medical care if:  You have vaginal bleeding and you are pregnant. This information is not intended to replace advice given to you by your health care provider. Make sure you discuss any questions you have with your health care provider. Document Released: 01/12/2011 Document Revised: 02/23/2016 Document Reviewed: 03/21/2015 Elsevier Interactive Patient Education  2017 [employer]

## 2017-02-06 NOTE — Progress Notes (Signed)
Prenatal Visit Note Date: 02/06/2017 Clinic: [facility]  Subjective:  Dwan Boltetley Renee Zhong is a 31 y.o. [phone] at 4634w4d being seen today for ongoing prenatal care.  She is currently monitored for the following issues for this high-risk pregnancy and has Supervision of high-risk pregnancy; History of preterm delivery, currently pregnant; Tobacco smoking affecting pregnancy in first trimester; Anxiety during pregnancy, antepartum; Short cervical length during pregnancy in second trimester; and Cervical cerclage suture present in second trimester on her problem list.  Patient reports no complaints.   Contractions: Not present.  .  Movement: Present. Denies leaking of fluid.   The following portions of the patient's history were reviewed and updated as appropriate: allergies, current medications, past family history, past medical history, past social history, past surgical history and problem list. Problem list updated.  Objective:   Vitals:   02/06/17 1400  BP: 122/60  Pulse: 78  Weight: 131 lb 8 oz (59.6 kg)    Fetal Status: Fetal Heart Rate (bpm): 145   Movement: Present     General:  Alert, oriented and cooperative. Patient is in no acute distress.  Skin: Skin is warm and dry. No rash noted.   Cardiovascular: Normal heart rate noted  Respiratory: Normal respiratory effort, no problems with respiration noted  Abdomen: Soft, gravid, appropriate for gestational age. Pain/Pressure: Absent     Pelvic:  Cervical exam deferred        Extremities: Normal range of motion.     Mental Status: Normal mood and affect. Normal behavior. Normal judgment and thought content.   Urinalysis:      Assessment and Plan:  Pregnancy: [id number] at 134w4d  1. Supervision of high risk pregnancy in second trimester Routine care.   2. Cervical cerclage suture present in second trimester Rpt CL today (2cm on 4/25). f/u results. Pt using prometrium w/o issue she states and continue with  that and qwk 17p. D/w her when would need BMZ course  3. Short cervical length during pregnancy in second trimester See above  Preterm labor symptoms and general obstetric precautions including but not limited to vaginal bleeding, contractions, leaking of fluid and fetal movement were reviewed in detail with the patient. Please refer to After Visit Summary for other counseling recommendations.  Return in about 2 weeks (around 02/20/2017) for rob.   [place] BingPickens, Gelisa Tieken, MD

## 2017-02-06 NOTE — Progress Notes (Signed)
17 p given today

## 2017-02-08 ENCOUNTER — Inpatient Hospital Stay (HOSPITAL_COMMUNITY)
Admission: AD | Admit: 2017-02-08 | Discharge: 2017-02-09 | Disposition: A | Discharge status: Home / Self Care | Payer: BLUE CROSS/BLUE SHIELD | Source: Ambulatory Visit | Attending: Family Medicine | Admitting: Family Medicine

## 2017-02-08 DIAGNOSIS — O26892 Other specified pregnancy related conditions, second trimester: Secondary | ICD-10-CM | POA: Insufficient documentation

## 2017-02-08 DIAGNOSIS — Z3492 Encounter for supervision of normal pregnancy, unspecified, second trimester: Secondary | ICD-10-CM

## 2017-02-08 DIAGNOSIS — O09219 Supervision of pregnancy with history of pre-term labor, unspecified trimester: Secondary | ICD-10-CM

## 2017-02-08 DIAGNOSIS — O26872 Cervical shortening, second trimester: Secondary | ICD-10-CM

## 2017-02-08 DIAGNOSIS — N898 Other specified noninflammatory disorders of vagina: Secondary | ICD-10-CM

## 2017-02-08 DIAGNOSIS — O3432 Maternal care for cervical incompetence, second trimester: Secondary | ICD-10-CM

## 2017-02-08 DIAGNOSIS — O09212 Supervision of pregnancy with history of pre-term labor, second trimester: Secondary | ICD-10-CM | POA: Insufficient documentation

## 2017-02-08 DIAGNOSIS — R011 Cardiac murmur, unspecified: Secondary | ICD-10-CM | POA: Insufficient documentation

## 2017-02-08 DIAGNOSIS — O09899 Supervision of other high risk pregnancies, unspecified trimester: Secondary | ICD-10-CM

## 2017-02-08 DIAGNOSIS — Z3A23 23 weeks gestation of pregnancy: Secondary | ICD-10-CM | POA: Insufficient documentation

## 2017-02-08 DIAGNOSIS — F419 Anxiety disorder, unspecified: Secondary | ICD-10-CM | POA: Insufficient documentation

## 2017-02-08 DIAGNOSIS — Z87891 Personal history of nicotine dependence: Secondary | ICD-10-CM | POA: Insufficient documentation

## 2017-02-08 DIAGNOSIS — O99342 Other mental disorders complicating pregnancy, second trimester: Secondary | ICD-10-CM | POA: Insufficient documentation

## 2017-02-09 ENCOUNTER — Encounter (HOSPITAL_COMMUNITY): Payer: Self-pay

## 2017-02-09 DIAGNOSIS — F419 Anxiety disorder, unspecified: Secondary | ICD-10-CM | POA: Diagnosis not present

## 2017-02-09 DIAGNOSIS — O3432 Maternal care for cervical incompetence, second trimester: Secondary | ICD-10-CM

## 2017-02-09 DIAGNOSIS — N898 Other specified noninflammatory disorders of vagina: Secondary | ICD-10-CM | POA: Diagnosis not present

## 2017-02-09 DIAGNOSIS — O26892 Other specified pregnancy related conditions, second trimester: Secondary | ICD-10-CM | POA: Diagnosis not present

## 2017-02-09 DIAGNOSIS — O09212 Supervision of pregnancy with history of pre-term labor, second trimester: Secondary | ICD-10-CM | POA: Diagnosis not present

## 2017-02-09 DIAGNOSIS — Z3A23 23 weeks gestation of pregnancy: Secondary | ICD-10-CM | POA: Diagnosis not present

## 2017-02-09 DIAGNOSIS — O99342 Other mental disorders complicating pregnancy, second trimester: Secondary | ICD-10-CM | POA: Diagnosis not present

## 2017-02-09 DIAGNOSIS — R011 Cardiac murmur, unspecified: Secondary | ICD-10-CM | POA: Diagnosis not present

## 2017-02-09 DIAGNOSIS — Z87891 Personal history of nicotine dependence: Secondary | ICD-10-CM | POA: Diagnosis not present

## 2017-02-09 NOTE — MAU Note (Signed)
?  Mucus plug passed an hour ago.  Yellow and thick, it was hanging out (took picture).  Last intercourse was in March. Has a cerclage, since April.  No bleeding. No leaking. Baby moving.

## 2017-02-09 NOTE — MAU Provider Note (Signed)
History     CSN: [id number]  Arrival date and time: 02/08/17 2349  First Provider Initiated Contact with Patient 02/09/17 0100      Chief Complaint  Patient presents with  . Vaginal Discharge   HPI Beth Giles is a 31 y.o. [phone] at [redacted]w[redacted]d who presents with vaginal discharge. Reports passing a yellow glob of mucous this evening. History of preterm delivery at 28 wks & currently has cervical cerclage. No intercourse since cerclage placement. Denies abdominal pain, vaginal bleeding, or LOF. Positive fetal movement.   OB History    Gravida Para Term Preterm AB Living   3 1 0 1 1 1    SAB TAB Ectopic Multiple Live Births   0 1 0 0 1      Past Medical History:  Diagnosis Date  . Anxiety   . Heart murmur    never has caused any problems  . Termination of pregnancy (fetus)    x1    Past Surgical History:  Procedure Laterality Date  . CERVICAL CERCLAGE N/A 12/31/2016   Procedure: CERCLAGE CERVICAL;  Surgeon: Adam Phenix, MD;  Location: [facility];  Service: Gynecology;  Laterality: N/A;  . WISDOM TOOTH EXTRACTION      Family History  Problem Relation Age of Onset  . Hypertension Mother   . Diabetes Mother   . Hypertension Father   . Hypertension Sister     Social History  Substance Use Topics  . Smoking status: Former Smoker    Packs/day: 0.20    Years: 15.00    Types: Cigarettes    Quit date: 12/14/2016  . Smokeless tobacco: Never Used  . Alcohol use No    Allergies:  Allergies  Allergen Reactions  . Penicillins Cross Reactors Anaphylaxis and Rash    Unknown to pt Has patient had a PCN reaction causing immediate rash, facial/tongue/throat swelling, SOB or lightheadedness with hypotension: Yes Has patient had a PCN reaction causing severe rash involving mucus membranes or skin necrosis:No Has patient had a PCN reaction that required hospitalization No Has patient had a PCN reaction occurring within the last 10 years: No If all of the above answers are  "NO", then may proceed with Cephalosporin use.   Marland Kitchen Hydrocodone Swelling and Rash    Tongue swell, rash all over    Facility-Administered Medications Prior to Admission  Medication Dose Route Frequency Provider Last Rate Last Dose  . hydroxyprogesterone caproate (MAKENA) 250 mg/mL injection 250 mg  250 mg Intramuscular Weekly Reva Bores, MD   250 mg at 02/06/17 1401   Prescriptions Prior to Admission  Medication Sig Dispense Refill Last Dose  . acetaminophen (TYLENOL) 325 MG tablet Take 325 mg by mouth every 6 (six) hours as needed for mild pain. For pain    Taking  . busPIRone (BUSPAR) 10 MG tablet Take 1 tablet (10 mg total) by mouth 3 (three) times daily. 90 tablet 3 Taking  . hydrOXYzine (VISTARIL) 50 MG capsule Take 1 capsule (50 mg total) by mouth 3 (three) times daily as needed. 30 capsule 3 Taking  . Prenatal Vit w/Fe-Methylfol-FA (PNV PO) Take 1 tablet by mouth daily.    Taking  . progesterone (PROMETRIUM) 200 MG capsule Place one capsule vaginally at bedtime 30 capsule 3 Taking    Review of Systems  Constitutional: Negative.   Gastrointestinal: Negative.   Genitourinary: Positive for vaginal discharge. Negative for vaginal bleeding.   Physical Exam   Blood pressure 125/75, pulse 80, temperature 98.2 F (36.8  C), temperature source Oral, resp. rate 18, last menstrual period 09/01/2016, SpO2 100 %.  Physical Exam  Nursing note and vitals reviewed. Constitutional: She is oriented to person, place, and time. She appears well-developed and well-nourished. No distress.  HENT:  Head: Normocephalic and atraumatic.  Eyes: Conjunctivae are normal. Right eye exhibits no discharge. Left eye exhibits no discharge. No scleral icterus.  Neck: Normal range of motion.  Cardiovascular: Normal rate, regular rhythm and normal heart sounds.   No murmur heard. Respiratory: Effort normal and breath sounds normal. No respiratory distress. She has no wheezes.  GI: Soft. There is no  tenderness.  Genitourinary: No bleeding in the vagina. Vaginal discharge (small amount of thick white discharge) found.  Genitourinary Comments: Cerclage stitch visualized @ 12 o'clock position. No evidence of tension. Cervix visually & digitally closed.   Neurological: She is alert and oriented to person, place, and time.  Skin: Skin is warm and dry. She is not diaphoretic.  Psychiatric: She has a normal mood and affect. Her behavior is normal. Judgment and thought content normal.    MAU Course  Procedures No results found for this or any previous visit (from the past 24 hour(s)).  MDM FHT 140 Attempted to place pt on external monitor by 2 RNs & myself --- fetal movement heard, but unable to obtain tracing. Heart tones heard with doppler multiple times during visit. Pt turned 23 wks at midnight.  Cerclage visualized & palpated -- cervix closed, no tension on stitch. Small amount of discharge one exam, no evidence of ROM.  Assessment and Plan  A; 1. Cervical cerclage suture present in second trimester   2. Short cervical length during pregnancy in second trimester   3. History of preterm delivery, currently pregnant   4. Fetal heart tones present, second trimester   5. Vaginal discharge during pregnancy in second trimester    P: Discharge home Preterm labor precautions Keep f/u with OB  Judeth HornErin Amiyah Shryock 02/09/2017, 1:00 AM

## 2017-02-09 NOTE — Discharge Instructions (Signed)
. °Preterm Labor and Birth Information °The normal length of a pregnancy is 39-41 weeks. Preterm labor is when labor starts before 37 completed weeks of pregnancy. °What are the risk factors for preterm labor? °Preterm labor is more likely to occur in women who: °· Have certain infections during pregnancy such as a bladder infection, sexually transmitted infection, or infection inside the uterus (chorioamnionitis). °· Have a shorter-than-normal cervix. °· Have gone into preterm labor before. °· Have had surgery on their cervix. °· Are younger than age 17 or older than age 35. °· Are African American. °· Are pregnant with twins or multiple babies (multiple gestation). °· Take street drugs or smoke while pregnant. °· Do not gain enough weight while pregnant. °· Became pregnant shortly after having been pregnant. °What are the symptoms of preterm labor? °Symptoms of preterm labor include: °· Cramps similar to those that can happen during a menstrual period. The cramps may happen with diarrhea. °· Pain in the abdomen or lower back. °· Regular uterine contractions that may feel like tightening of the abdomen. °· A feeling of increased pressure in the pelvis. °· Increased watery or bloody mucus discharge from the vagina. °· Water breaking (ruptured amniotic sac). °Why is it important to recognize signs of preterm labor? °It is important to recognize signs of preterm labor because babies who are born prematurely may not be fully developed. This can put them at an increased risk for: °· Long-term (chronic) heart and lung problems. °· Difficulty immediately after birth with regulating body systems, including blood sugar, body temperature, heart rate, and breathing rate. °· Bleeding in the brain. °· Cerebral palsy. °· Learning difficulties. °· Death. °These risks are highest for babies who are born before 34 weeks of pregnancy. °How is preterm labor treated? °Treatment depends on the length of your pregnancy, your condition,  and the health of your baby. It may involve: °· Having a stitch (suture) placed in your cervix to prevent your cervix from opening too early (cerclage). °· Taking or being given medicines, such as: °¨ Hormone medicines. These may be given early in pregnancy to help support the pregnancy. °¨ Medicine to stop contractions. °¨ Medicines to help mature the baby’s lungs. These may be prescribed if the risk of delivery is high. °¨ Medicines to prevent your baby from developing cerebral palsy. °If the labor happens before 34 weeks of pregnancy, you may need to stay in the hospital. °What should I do if I think I am in preterm labor? °If you think that you are going into preterm labor, call your health care provider right away. °How can I prevent preterm labor in future pregnancies? °To increase your chance of having a full-term pregnancy: °· Do not use any tobacco products, such as cigarettes, chewing tobacco, and e-cigarettes. If you need help quitting, ask your health care provider. °· Do not use street drugs or medicines that have not been prescribed to you during your pregnancy. °· Talk with your health care provider before taking any herbal supplements, even if you have been taking them regularly. °· Make sure you gain a healthy amount of weight during your pregnancy. °· Watch for infection. If you think that you might have an infection, get it checked right away. °· Make sure to tell your health care provider if you have gone into preterm labor before. °This information is not intended to replace advice given to you by your health care provider. Make sure you discuss any questions you have with your   health care provider. °Document Released: 12/08/2003 Document Revised: 02/28/2016 Document Reviewed: 02/08/2016 °Elsevier Interactive Patient Education © 2017 Elsevier Inc. ° °

## 2017-02-13 ENCOUNTER — Ambulatory Visit (INDEPENDENT_AMBULATORY_CARE_PROVIDER_SITE_OTHER): Payer: BLUE CROSS/BLUE SHIELD | Admitting: *Deleted

## 2017-02-13 ENCOUNTER — Encounter: Payer: Self-pay | Admitting: Family Medicine

## 2017-02-13 VITALS — BP 123/62 | HR 85 | Wt 135.7 lb

## 2017-02-13 DIAGNOSIS — O09899 Supervision of other high risk pregnancies, unspecified trimester: Secondary | ICD-10-CM

## 2017-02-13 DIAGNOSIS — O09212 Supervision of pregnancy with history of pre-term labor, second trimester: Secondary | ICD-10-CM

## 2017-02-13 DIAGNOSIS — O09219 Supervision of pregnancy with history of pre-term labor, unspecified trimester: Principal | ICD-10-CM

## 2017-02-13 DIAGNOSIS — O26872 Cervical shortening, second trimester: Secondary | ICD-10-CM

## 2017-02-13 NOTE — Progress Notes (Addendum)
Makena 250 mg administered as scheduled.  Pt tolerated well. Rx refill scheduled for delivery to office on 02/21/17.

## 2017-02-20 ENCOUNTER — Encounter (HOSPITAL_COMMUNITY): Payer: Self-pay

## 2017-02-20 ENCOUNTER — Other Ambulatory Visit (HOSPITAL_COMMUNITY): Payer: Self-pay | Admitting: Obstetrics and Gynecology

## 2017-02-20 ENCOUNTER — Ambulatory Visit (HOSPITAL_COMMUNITY)
Admission: RE | Admit: 2017-02-20 | Discharge: 2017-02-20 | Disposition: A | Discharge status: Home / Self Care | Payer: BLUE CROSS/BLUE SHIELD | Source: Ambulatory Visit | Attending: Obstetrics & Gynecology | Admitting: Obstetrics & Gynecology

## 2017-02-20 ENCOUNTER — Ambulatory Visit (INDEPENDENT_AMBULATORY_CARE_PROVIDER_SITE_OTHER): Payer: BLUE CROSS/BLUE SHIELD | Admitting: Obstetrics and Gynecology

## 2017-02-20 VITALS — BP 134/68 | HR 80 | Wt 136.8 lb

## 2017-02-20 DIAGNOSIS — O3432 Maternal care for cervical incompetence, second trimester: Secondary | ICD-10-CM | POA: Diagnosis not present

## 2017-02-20 DIAGNOSIS — Z8751 Personal history of pre-term labor: Secondary | ICD-10-CM

## 2017-02-20 DIAGNOSIS — O09212 Supervision of pregnancy with history of pre-term labor, second trimester: Secondary | ICD-10-CM

## 2017-02-20 DIAGNOSIS — Z3A24 24 weeks gestation of pregnancy: Secondary | ICD-10-CM | POA: Insufficient documentation

## 2017-02-20 DIAGNOSIS — O26872 Cervical shortening, second trimester: Secondary | ICD-10-CM

## 2017-02-20 DIAGNOSIS — O09219 Supervision of pregnancy with history of pre-term labor, unspecified trimester: Secondary | ICD-10-CM

## 2017-02-20 DIAGNOSIS — O0992 Supervision of high risk pregnancy, unspecified, second trimester: Secondary | ICD-10-CM

## 2017-02-20 DIAGNOSIS — O09899 Supervision of other high risk pregnancies, unspecified trimester: Secondary | ICD-10-CM

## 2017-02-20 MED ORDER — BETAMETHASONE SOD PHOS & ACET 6 (3-3) MG/ML IJ SUSP
12.0000 mg | INTRAMUSCULAR | Status: AC
  Administered 2017-02-20 – 2017-02-21 (×2): 12 mg via INTRAMUSCULAR

## 2017-02-20 NOTE — Progress Notes (Signed)
   PRENATAL VISIT NOTE  Subjective:  Beth Giles is a 31 y.o. [phone] at 1416w4d being seen today for ongoing prenatal care.  She is currently monitored for the following issues for this high-risk pregnancy and has Supervision of high-risk pregnancy; History of preterm delivery, currently pregnant; Tobacco smoking affecting pregnancy in first trimester; Anxiety during pregnancy, antepartum; Short cervical length during pregnancy in second trimester; and Cervical cerclage suture present in second trimester on her problem list.  Patient reports no complaints.  Contractions: Not present. Vag. Bleeding: None.  Movement: Present. Denies leaking of fluid.   The following portions of the patient's history were reviewed and updated as appropriate: allergies, current medications, past family history, past medical history, past social history, past surgical history and problem list. Problem list updated.  Objective:   Vitals:   02/20/17 1425  BP: 134/68  Pulse: 80  Weight: 136 lb 12.8 oz (62.1 kg)    Fetal Status: Fetal Heart Rate (bpm): 147   Movement: Present     General:  Alert, oriented and cooperative. Patient is in no acute distress.  Skin: Skin is warm and dry. No rash noted.   Cardiovascular: Normal heart rate noted  Respiratory: Normal respiratory effort, no problems with respiration noted  Abdomen: Soft, gravid, appropriate for gestational age. Pain/Pressure: Present     Pelvic:  Cervical exam deferred        Extremities: Normal range of motion.  Edema: Trace  Mental Status: Normal mood and affect. Normal behavior. Normal judgment and thought content.   Assessment and Plan:  Pregnancy: [id number] at 9816w4d  1. Supervision of high risk pregnancy in second trimester Patient is doing well without complaints Third trimester labs and glucola next visit  2. Cervical cerclage suture present in second trimester Continue prometrium and weekly 17-P Follow up cervical length today Will  provide BMZ today and tomorrow as patient has reached viability.  3. Short cervical length during pregnancy in second trimester   4. History of preterm delivery, currently pregnant   Preterm labor symptoms and general obstetric precautions including but not limited to vaginal bleeding, contractions, leaking of fluid and fetal movement were reviewed in detail with the patient. Please refer to After Visit Summary for other counseling recommendations.  Return in about 4 weeks (around 03/20/2017) for ROB, 2 hr glucola next visit.   Catalina AntiguaPeggy Quetzaly Ebner, MD

## 2017-02-21 ENCOUNTER — Ambulatory Visit (INDEPENDENT_AMBULATORY_CARE_PROVIDER_SITE_OTHER): Payer: BLUE CROSS/BLUE SHIELD | Admitting: *Deleted

## 2017-02-21 DIAGNOSIS — O0992 Supervision of high risk pregnancy, unspecified, second trimester: Secondary | ICD-10-CM

## 2017-02-21 DIAGNOSIS — O09212 Supervision of pregnancy with history of pre-term labor, second trimester: Secondary | ICD-10-CM

## 2017-02-27 ENCOUNTER — Ambulatory Visit (INDEPENDENT_AMBULATORY_CARE_PROVIDER_SITE_OTHER): Payer: BLUE CROSS/BLUE SHIELD | Admitting: *Deleted

## 2017-02-27 VITALS — BP 123/68 | HR 85 | Wt 137.6 lb

## 2017-02-27 DIAGNOSIS — O09899 Supervision of other high risk pregnancies, unspecified trimester: Secondary | ICD-10-CM

## 2017-02-27 DIAGNOSIS — O09219 Supervision of pregnancy with history of pre-term labor, unspecified trimester: Principal | ICD-10-CM

## 2017-02-27 DIAGNOSIS — O09212 Supervision of pregnancy with history of pre-term labor, second trimester: Secondary | ICD-10-CM

## 2017-02-27 NOTE — Progress Notes (Signed)
Makena 250 mg IM administered as scheduled.  Pt tolerated well.  

## 2017-03-06 ENCOUNTER — Ambulatory Visit (INDEPENDENT_AMBULATORY_CARE_PROVIDER_SITE_OTHER): Payer: BLUE CROSS/BLUE SHIELD | Admitting: *Deleted

## 2017-03-06 ENCOUNTER — Encounter (HOSPITAL_COMMUNITY): Payer: Self-pay

## 2017-03-06 ENCOUNTER — Ambulatory Visit (HOSPITAL_COMMUNITY)
Admission: RE | Admit: 2017-03-06 | Discharge: 2017-03-06 | Disposition: A | Discharge status: Home / Self Care | Payer: BLUE CROSS/BLUE SHIELD | Source: Ambulatory Visit | Attending: Obstetrics & Gynecology | Admitting: Obstetrics & Gynecology

## 2017-03-06 VITALS — BP 127/67 | HR 87 | Wt 138.6 lb

## 2017-03-06 DIAGNOSIS — O3432 Maternal care for cervical incompetence, second trimester: Secondary | ICD-10-CM | POA: Diagnosis not present

## 2017-03-06 DIAGNOSIS — O09212 Supervision of pregnancy with history of pre-term labor, second trimester: Secondary | ICD-10-CM

## 2017-03-06 DIAGNOSIS — O09899 Supervision of other high risk pregnancies, unspecified trimester: Secondary | ICD-10-CM

## 2017-03-06 DIAGNOSIS — O26872 Cervical shortening, second trimester: Secondary | ICD-10-CM | POA: Diagnosis not present

## 2017-03-06 DIAGNOSIS — Z8751 Personal history of pre-term labor: Secondary | ICD-10-CM | POA: Diagnosis not present

## 2017-03-06 DIAGNOSIS — O09219 Supervision of pregnancy with history of pre-term labor, unspecified trimester: Principal | ICD-10-CM

## 2017-03-06 DIAGNOSIS — Z3A26 26 weeks gestation of pregnancy: Secondary | ICD-10-CM | POA: Diagnosis not present

## 2017-03-06 NOTE — Progress Notes (Signed)
Beth Giles 250 mg IM administered as scheduled.  Pt tolerated well.  

## 2017-03-06 NOTE — Addendum Note (Signed)
Encounter addended by: Lenoard AdenJohnson, Myrlene Riera M, RT on: 03/06/2017  3:49 PM<BR>    Actions taken: Imaging Exam ended

## 2017-03-14 ENCOUNTER — Ambulatory Visit: Payer: BLUE CROSS/BLUE SHIELD

## 2017-03-20 ENCOUNTER — Telehealth: Payer: Self-pay | Admitting: *Deleted

## 2017-03-20 ENCOUNTER — Ambulatory Visit (INDEPENDENT_AMBULATORY_CARE_PROVIDER_SITE_OTHER): Payer: BLUE CROSS/BLUE SHIELD | Admitting: Obstetrics & Gynecology

## 2017-03-20 VITALS — BP 127/83 | HR 87 | Wt 136.8 lb

## 2017-03-20 DIAGNOSIS — O0990 Supervision of high risk pregnancy, unspecified, unspecified trimester: Secondary | ICD-10-CM | POA: Diagnosis not present

## 2017-03-20 DIAGNOSIS — O09213 Supervision of pregnancy with history of pre-term labor, third trimester: Secondary | ICD-10-CM | POA: Diagnosis not present

## 2017-03-20 DIAGNOSIS — Z23 Encounter for immunization: Secondary | ICD-10-CM

## 2017-03-20 DIAGNOSIS — O09899 Supervision of other high risk pregnancies, unspecified trimester: Secondary | ICD-10-CM

## 2017-03-20 DIAGNOSIS — O09219 Supervision of pregnancy with history of pre-term labor, unspecified trimester: Secondary | ICD-10-CM

## 2017-03-20 DIAGNOSIS — O0993 Supervision of high risk pregnancy, unspecified, third trimester: Secondary | ICD-10-CM

## 2017-03-20 MED ORDER — HYDROXYPROGESTERONE CAPROATE 250 MG/ML IM OIL
250.0000 mg | TOPICAL_OIL | Freq: Once | INTRAMUSCULAR | Status: AC
  Administered 2017-03-20: 250 mg via INTRAMUSCULAR

## 2017-03-20 NOTE — Patient Instructions (Addendum)
Return to clinic for any scheduled appointments or obstetric concerns, or go to [facility] for evaluation  AREA PEDIATRIC/FAMILY PRACTICE PHYSICIANS  [facility] [address], [address] [place], [place]  [address] Phone - [phone]   Fax - [phone]  [facility] [address] [place], [place] [address] Phone - [phone]   Fax - [phone]  JACK AMOS [address] [place], [place]  [address] Phone - [phone]   Fax - [phone]  [facility] [address] [place], [place]  [address] Phone - [phone]   Fax - [phone]  [facility] [address] [place], [place]  [address] Phone - [phone]   Fax - [phone]  [facility] [address] [place], [place]  [address] Phone - [phone]   Fax - [phone]  [facility] [address] [place], [place]  [address] Phone - [phone]   Fax - [phone]  [facility] [address], [address] [place], [place]  [address] Phone - [phone]   Fax - [phone]  [facility] [address] [place], [place]  [address] Phone - [phone]   Fax - [phone] [facility] [address] [place], [place]  [address] Phone - [phone]   Fax - [phone]  [facility] [address] [place], [place]  [address] Phone - [phone]   Fax - [phone]  [facility] [address] [place], [place]  [address] Phone - [phone]   Fax - [phone]  [facility] [address] [place], [place]  [address] Phone - [phone]   Fax - [phone]  SHILPA GOSRANI [address] [place], [place]  [address] Phone - [phone]  [facility] [address] [place], [place]  [address] Phone - [phone]   Fax - [phone]  [facility] [address] [place], [place]  [address] Phone - [phone]   Fax - [phone]  [facility] [address] [place], [place]  [address] Phone - [phone]   Fax - [phone]  [facility] [address] [place], [place]  [address] Phone - [phone]   Fax - [phone]   [facility] [address] [place], [place]  [address] Phone - [phone]   Fax - [phone]  [facility] [address] [place], [place]  [address] Phone - [phone]   Fax - [phone]  DAVID RUBIN [address] [place], [place]  [address] Phone - [phone]   Fax - [phone]  [facility] [address] , [place]  [address] Phone - [phone]   Fax - [phone]  [facility] [address] , [place]  [address] Phone - [phone]   Fax - [phone] [facility] [address] [place], [place]  [address] Phone - [phone]   Fax - [phone]  [facility] [address] [place], [place]  [address] Phone - [phone]   Fax - [phone]  [facility] [address] [place], [place]  [address] Phone - [phone]   Fax - [phone]  [facility] Charlene Flemming MD [address] [place] [address] Phone [phone]  Fax   [phone]   Third Trimester of Pregnancy The third trimester is from week 28 through week 40 (months 7 through 9). The third trimester is a time when the unborn baby (fetus) is growing rapidly. At the end of the ninth month, the fetus is about 20 inches in length and weighs 6-10 pounds. Body changes during your third trimester Your body will continue to go through many changes during  pregnancy. The changes vary from woman to woman. During the third trimester:  Your weight will continue to increase. You can expect to gain 25-35 pounds (11-16 kg) by the end of the pregnancy.  You may begin to get stretch marks on your hips, abdomen, and breasts.  You may urinate more often because the fetus is moving lower into your pelvis and pressing on your bladder.  You may develop or continue to have heartburn. This is caused by increased hormones that slow down muscles in the digestive tract.  You may develop or continue to have constipation because increased hormones slow digestion and cause the muscles that push waste through your intestines to relax.  You may develop hemorrhoids. These are swollen veins (varicose veins) in the rectum that can itch or be painful.  You may develop swollen, bulging veins (varicose veins) in your legs.  You may have increased body aches in the pelvis, back, or thighs. This is due to weight gain and increased hormones that are relaxing your joints.  You may have changes in your hair. These can include thickening of your hair, rapid growth, and changes in texture. Some women also have hair loss during or after pregnancy, or hair that feels dry or thin. Your hair will most likely return to normal after your baby is born.  Your breasts will continue to grow and they will continue to become tender. A yellow fluid (colostrum) may leak from your breasts. This is the first milk you are producing for your baby.  Your belly button may stick out.  You may notice more swelling in your hands, face, or ankles.  You may have increased tingling or numbness in your hands, arms, and legs. The skin on your belly may also feel numb.  You may feel short of breath because of your expanding uterus.  You may have more problems sleeping. This can be caused by the size of your belly, increased need to urinate, and an increase in your body's metabolism.  You may notice  the fetus "dropping," or moving lower in your abdomen (lightening).  You may have increased vaginal discharge.  You may notice your joints feel loose and you may have pain around your pelvic bone.  What to expect at prenatal visits You will have prenatal exams every 2 weeks until week 36. Then you will have weekly prenatal exams. During a routine prenatal visit:  You will be weighed to make sure you and the baby are growing normally.  Your blood pressure will be taken.  Your abdomen will be measured to track your baby's growth.  The fetal heartbeat will be listened to.  Any test results from the previous visit will be discussed.  You may have a cervical check near your due date to see if your cervix has softened or thinned (effaced).  You will be tested for Group B streptococcus. This happens between 35 and 37 weeks.  Your health care provider may ask you:  What your birth plan is.  How you are feeling.  If you are feeling the baby move.    If you have had any abnormal symptoms, such as leaking fluid, bleeding, severe headaches, or abdominal cramping.  If you are using any tobacco products, including cigarettes, chewing tobacco, and electronic cigarettes.  If you have any questions.  Other tests or screenings that may be performed during your third trimester include:  Blood tests that check for low iron levels (anemia).  Fetal testing to check the health, activity level, and growth of the fetus. Testing is done if you have certain medical conditions or if there are problems during the pregnancy.  Nonstress test (NST). This test checks the health of your baby to make sure there are no signs of problems, such as the baby not getting enough oxygen. During this test, a belt is placed around your belly. The baby is made to move, and its heart rate is monitored during movement.  What is false labor? False labor is a condition in which you feel small, irregular tightenings of the  muscles in the womb (contractions) that usually go away with rest, changing position, or drinking water. These are called Braxton Hicks contractions. Contractions may last for hours, days, or even weeks before true labor sets in. If contractions come at regular intervals, become more frequent, increase in intensity, or become painful, you should see your health care provider. What are the signs of labor?  Abdominal cramps.  Regular contractions that start at 10 minutes apart and become stronger and more frequent with time.  Contractions that start on the top of the uterus and spread down to the lower abdomen and back.  Increased pelvic pressure and dull back pain.  A watery or bloody mucus discharge that comes from the vagina.  Leaking of amniotic fluid. This is also known as your "water breaking." It could be a slow trickle or a gush. Let your health care provider know if it has a color or strange odor. If you have any of these signs, call your health care provider right away, even if it is before your due date. Follow these instructions at home: Medicines  Follow your health care provider's instructions regarding medicine use. Specific medicines may be either safe or unsafe to take during pregnancy.  Take a prenatal vitamin that contains at least 600 micrograms (mcg) of folic acid.  If you develop constipation, try taking a stool softener if your health care provider approves. Eating and drinking  Eat a balanced diet that includes fresh fruits and vegetables, whole grains, good sources of protein such as meat, eggs, or tofu, and low-fat dairy. Your health care provider will help you determine the amount of weight gain that is right for you.  Avoid raw meat and uncooked cheese. These carry germs that can cause birth defects in the baby.  If you have low calcium intake from food, talk to your health care provider about whether you should take a daily calcium supplement.  Eat four or  five small meals rather than three large meals a day.  Limit foods that are high in fat and processed sugars, such as fried and sweet foods.  To prevent constipation: ? Drink enough fluid to keep your urine clear or pale yellow. ? Eat foods that are high in fiber, such as fresh fruits and vegetables, whole grains, and beans. Activity  Exercise only as directed by your health care provider. Most women can continue their usual exercise routine during pregnancy. Try to exercise for 30 minutes at least 5 days a week. Stop exercising if you experience uterine   contractions.  Avoid heavy lifting.  Do not exercise in extreme heat or humidity, or at high altitudes.  Wear low-heel, comfortable shoes.  Practice good posture.  You may continue to have sex unless your health care provider tells you otherwise. Relieving pain and discomfort  Take frequent breaks and rest with your legs elevated if you have leg cramps or low back pain.  Take warm sitz baths to soothe any pain or discomfort caused by hemorrhoids. Use hemorrhoid cream if your health care provider approves.  Wear a good support bra to prevent discomfort from breast tenderness.  If you develop varicose veins: ? Wear support pantyhose or compression stockings as told by your healthcare provider. ? Elevate your feet for 15 minutes, 3-4 times a day. Prenatal care  Write down your questions. Take them to your prenatal visits.  Keep all your prenatal visits as told by your health care provider. This is important. Safety  Wear your seat belt at all times when driving.  Make a list of emergency phone numbers, including numbers for family, friends, the hospital, and police and fire departments. General instructions  Avoid cat litter boxes and soil used by cats. These carry germs that can cause birth defects in the baby. If you have a cat, ask someone to clean the litter box for you.  Do not travel far distances unless it is  absolutely necessary and only with the approval of your health care provider.  Do not use hot tubs, steam rooms, or saunas.  Do not drink alcohol.  Do not use any products that contain nicotine or tobacco, such as cigarettes and e-cigarettes. If you need help quitting, ask your health care provider.  Do not use any medicinal herbs or unprescribed drugs. These chemicals affect the formation and growth of the baby.  Do not douche or use tampons or scented sanitary pads.  Do not cross your legs for long periods of time.  To prepare for the arrival of your baby: ? Take prenatal classes to understand, practice, and ask questions about labor and delivery. ? Make a trial run to the hospital. ? Visit the hospital and tour the maternity area. ? Arrange for maternity or paternity leave through employers. ? Arrange for family and friends to take care of pets while you are in the hospital. ? Purchase a rear-facing car seat and make sure you know how to install it in your car. ? Pack your hospital bag. ? Prepare the baby's nursery. Make sure to remove all pillows and stuffed animals from the baby's crib to prevent suffocation.  Visit your dentist if you have not gone during your pregnancy. Use a soft toothbrush to brush your teeth and be gentle when you floss. Contact a health care provider if:  You are unsure if you are in labor or if your water has broken.  You become dizzy.  You have mild pelvic cramps, pelvic pressure, or nagging pain in your abdominal area.  You have lower back pain.  You have persistent nausea, vomiting, or diarrhea.  You have an unusual or bad smelling vaginal discharge.  You have pain when you urinate. Get help right away if:  Your water breaks before 37 weeks.  You have regular contractions less than 5 minutes apart before 37 weeks.  You have a fever.  You are leaking fluid from your vagina.  You have spotting or bleeding from your vagina.  You have  severe abdominal pain or cramping.  You have rapid weight   loss or weight gain.  You have shortness of breath with chest pain.  You notice sudden or extreme swelling of your face, hands, ankles, feet, or legs.  Your baby makes fewer than 10 movements in 2 hours.  You have severe headaches that do not go away when you take medicine.  You have vision changes. Summary  The third trimester is from week 28 through week 40, months 7 through 9. The third trimester is a time when the unborn baby (fetus) is growing rapidly.  During the third trimester, your discomfort may increase as you and your baby continue to gain weight. You may have abdominal, leg, and back pain, sleeping problems, and an increased need to urinate.  During the third trimester your breasts will keep growing and they will continue to become tender. A yellow fluid (colostrum) may leak from your breasts. This is the first milk you are producing for your baby.  False labor is a condition in which you feel small, irregular tightenings of the muscles in the womb (contractions) that eventually go away. These are called Braxton Hicks contractions. Contractions may last for hours, days, or even weeks before true labor sets in.  Signs of labor can include: abdominal cramps; regular contractions that start at 10 minutes apart and become stronger and more frequent with time; watery or bloody mucus discharge that comes from the vagina; increased pelvic pressure and dull back pain; and leaking of amniotic fluid. This information is not intended to replace advice given to you by your health care provider. Make sure you discuss any questions you have with your health care provider. Document Released: 09/11/2001 Document Revised: 02/23/2016 Document Reviewed: 11/18/2012 Elsevier Interactive Patient Education  2017 Elsevier Inc.  

## 2017-03-20 NOTE — Progress Notes (Signed)
Patient is getting T-Dap and Makena today.

## 2017-03-20 NOTE — Telephone Encounter (Signed)
Jae DireKate at [facility] is calling to schedule shipment of patients Makena.

## 2017-03-20 NOTE — Progress Notes (Addendum)
   PRENATAL VISIT NOTE  Subjective:  Beth Giles is a 31 y.o. [phone] at 3611w4d being seen today for ongoing prenatal care.  She is currently monitored for the following issues for this high-risk pregnancy and has Supervision of high-risk pregnancy; History of preterm delivery, currently pregnant; Tobacco smoking affecting pregnancy in first trimester; Anxiety during pregnancy, antepartum; Short cervical length during pregnancy in second trimester; and Cervical cerclage suture present in second trimester on her problem list.  Patient reports no complaints.  Contractions: Irregular. Vag. Bleeding: None.  Movement: Present. Denies leaking of fluid.   The following portions of the patient's history were reviewed and updated as appropriate: allergies, current medications, past family history, past medical history, past social history, past surgical history and problem list. Problem list updated.  Objective:   Vitals:   03/20/17 0940  BP: 127/83  Pulse: 87  Weight: 136 lb 12.8 oz (62.1 kg)    Fetal Status: Fetal Heart Rate (bpm): 140 Fundal Height: 28 cm Movement: Present     General:  Alert, oriented and cooperative. Patient is in no acute distress.  Skin: Skin is warm and dry. No rash noted.   Cardiovascular: Normal heart rate noted  Respiratory: Normal respiratory effort, no problems with respiration noted  Abdomen: Soft, gravid, appropriate for gestational age. Pain/Pressure: Present     Pelvic:  Cervical exam deferred        Extremities: Normal range of motion.  Edema: None  Mental Status: Normal mood and affect. Normal behavior. Normal judgment and thought content.   Assessment and Plan:  Pregnancy: [id number] at 7511w4d  1. History of preterm delivery, currently pregnant Continue weekly 17P - hydroxyprogesterone caproate (MAKENA) 250 mg/mL injection 250 mg; Inject 1 mL (250 mg total) into the muscle once.  2. Supervision of high risk pregnancy, antepartum Thrd trimester labs  labs - Glucose Tolerance, 2 Hours w/1 Hour - CBC - HIV antibody - RPR - Tdap vaccine greater than or equal to 7yo IM Preterm labor symptoms and general obstetric precautions including but not limited to vaginal bleeding, contractions, leaking of fluid and fetal movement were reviewed in detail with the patient. Please refer to After Visit Summary for other counseling recommendations.  Return in about 1 week (around 03/27/2017) for 17 only  2 weeks: OB Visit (HOB), 17P.   Jaynie CollinsUgonna Daeshon Grammatico, MD

## 2017-03-21 LAB — RPR: RPR: NONREACTIVE

## 2017-03-21 LAB — CBC
HEMATOCRIT: 37.6 % (ref 34.0–46.6)
HEMOGLOBIN: 12.1 g/dL (ref 11.1–15.9)
MCH: 33.5 pg — ABNORMAL HIGH (ref 26.6–33.0)
MCHC: 32.2 g/dL (ref 31.5–35.7)
MCV: 104 fL — ABNORMAL HIGH (ref 79–97)
Platelets: 354 10*3/uL (ref 150–379)
RBC: 3.61 x10E6/uL — AB (ref 3.77–5.28)
RDW: 12.1 % — ABNORMAL LOW (ref 12.3–15.4)
WBC: 8.8 10*3/uL (ref 3.4–10.8)

## 2017-03-21 LAB — GLUCOSE TOLERANCE, 2 HOURS W/ 1HR
GLUCOSE, 2 HOUR: 84 mg/dL (ref 65–152)
Glucose, 1 hour: 120 mg/dL (ref 65–179)
Glucose, Fasting: 69 mg/dL (ref 65–91)

## 2017-03-21 LAB — HIV ANTIBODY (ROUTINE TESTING W REFLEX): HIV Screen 4th Generation wRfx: NONREACTIVE

## 2017-03-27 ENCOUNTER — Ambulatory Visit (INDEPENDENT_AMBULATORY_CARE_PROVIDER_SITE_OTHER): Payer: BLUE CROSS/BLUE SHIELD

## 2017-03-27 VITALS — BP 129/68 | HR 79 | Wt 140.9 lb

## 2017-03-27 DIAGNOSIS — O09213 Supervision of pregnancy with history of pre-term labor, third trimester: Secondary | ICD-10-CM | POA: Diagnosis not present

## 2017-03-27 DIAGNOSIS — O09219 Supervision of pregnancy with history of pre-term labor, unspecified trimester: Principal | ICD-10-CM

## 2017-03-27 DIAGNOSIS — O09899 Supervision of other high risk pregnancies, unspecified trimester: Secondary | ICD-10-CM

## 2017-04-01 ENCOUNTER — Other Ambulatory Visit: Payer: Self-pay | Admitting: Family Medicine

## 2017-04-01 DIAGNOSIS — N760 Acute vaginitis: Principal | ICD-10-CM

## 2017-04-01 DIAGNOSIS — B9689 Other specified bacterial agents as the cause of diseases classified elsewhere: Secondary | ICD-10-CM

## 2017-04-04 ENCOUNTER — Ambulatory Visit (INDEPENDENT_AMBULATORY_CARE_PROVIDER_SITE_OTHER): Payer: BLUE CROSS/BLUE SHIELD | Admitting: General Practice

## 2017-04-04 DIAGNOSIS — O09899 Supervision of other high risk pregnancies, unspecified trimester: Secondary | ICD-10-CM

## 2017-04-04 DIAGNOSIS — O09219 Supervision of pregnancy with history of pre-term labor, unspecified trimester: Secondary | ICD-10-CM

## 2017-04-04 DIAGNOSIS — O3433 Maternal care for cervical incompetence, third trimester: Secondary | ICD-10-CM | POA: Diagnosis not present

## 2017-04-04 DIAGNOSIS — O3432 Maternal care for cervical incompetence, second trimester: Secondary | ICD-10-CM

## 2017-04-10 ENCOUNTER — Encounter: Payer: Self-pay | Admitting: General Practice

## 2017-04-10 ENCOUNTER — Ambulatory Visit (INDEPENDENT_AMBULATORY_CARE_PROVIDER_SITE_OTHER): Payer: BLUE CROSS/BLUE SHIELD | Admitting: *Deleted

## 2017-04-10 VITALS — BP 122/65 | HR 84 | Wt 139.7 lb

## 2017-04-10 DIAGNOSIS — O09213 Supervision of pregnancy with history of pre-term labor, third trimester: Secondary | ICD-10-CM

## 2017-04-10 DIAGNOSIS — O09219 Supervision of pregnancy with history of pre-term labor, unspecified trimester: Principal | ICD-10-CM

## 2017-04-10 DIAGNOSIS — O09899 Supervision of other high risk pregnancies, unspecified trimester: Secondary | ICD-10-CM

## 2017-04-10 NOTE — Progress Notes (Signed)
17P administered as scheduled.  Pt reports seeing a chiropractor x3 for pelvic and back pain however not helping the pain. Pt desires to schedule appt w/Dr. Adrian BlackwaterStinson for evaluation w/next prenatal visit.

## 2017-04-17 ENCOUNTER — Encounter: Payer: BLUE CROSS/BLUE SHIELD | Admitting: Obstetrics & Gynecology

## 2017-04-18 ENCOUNTER — Encounter: Payer: Self-pay | Admitting: Family Medicine

## 2017-04-18 ENCOUNTER — Ambulatory Visit (INDEPENDENT_AMBULATORY_CARE_PROVIDER_SITE_OTHER): Payer: BLUE CROSS/BLUE SHIELD | Admitting: Family Medicine

## 2017-04-18 VITALS — BP 126/63 | HR 89 | Wt 144.2 lb

## 2017-04-18 DIAGNOSIS — O09212 Supervision of pregnancy with history of pre-term labor, second trimester: Secondary | ICD-10-CM

## 2017-04-18 DIAGNOSIS — O99891 Other specified diseases and conditions complicating pregnancy: Secondary | ICD-10-CM

## 2017-04-18 DIAGNOSIS — M9904 Segmental and somatic dysfunction of sacral region: Secondary | ICD-10-CM

## 2017-04-18 DIAGNOSIS — M549 Dorsalgia, unspecified: Secondary | ICD-10-CM

## 2017-04-18 DIAGNOSIS — O0992 Supervision of high risk pregnancy, unspecified, second trimester: Secondary | ICD-10-CM

## 2017-04-18 DIAGNOSIS — M9905 Segmental and somatic dysfunction of pelvic region: Secondary | ICD-10-CM

## 2017-04-18 DIAGNOSIS — O3432 Maternal care for cervical incompetence, second trimester: Secondary | ICD-10-CM

## 2017-04-18 DIAGNOSIS — O0990 Supervision of high risk pregnancy, unspecified, unspecified trimester: Secondary | ICD-10-CM

## 2017-04-18 DIAGNOSIS — M9903 Segmental and somatic dysfunction of lumbar region: Secondary | ICD-10-CM | POA: Diagnosis not present

## 2017-04-18 DIAGNOSIS — O26893 Other specified pregnancy related conditions, third trimester: Secondary | ICD-10-CM | POA: Diagnosis not present

## 2017-04-18 DIAGNOSIS — O09892 Supervision of other high risk pregnancies, second trimester: Secondary | ICD-10-CM

## 2017-04-18 DIAGNOSIS — O9989 Other specified diseases and conditions complicating pregnancy, childbirth and the puerperium: Secondary | ICD-10-CM

## 2017-04-18 MED ORDER — HYDROXYPROGESTERONE CAPROATE 275 MG/1.1ML ~~LOC~~ SOAJ
275.0000 mg | Freq: Once | SUBCUTANEOUS | Status: AC
  Administered 2017-04-18: 275 mg via SUBCUTANEOUS

## 2017-04-18 NOTE — Progress Notes (Signed)
   PRENATAL VISIT NOTE  Subjective:  Beth Giles is a 31 y.o. [phone] at 697w5d being seen today for ongoing prenatal care.  She is currently monitored for the following issues for this high-risk pregnancy and has Supervision of high-risk pregnancy; History of preterm delivery, currently pregnant; Tobacco smoking affecting pregnancy in first trimester; Anxiety during pregnancy, antepartum; Short cervical length during pregnancy in second trimester; and Cervical cerclage suture present in second trimester on her problem list.  Patient reports backache on left. No radiation. Has had for 3 weeks - saw chiropractor, which was helpful for a few days. Pain worse when laying on left side. Contractions: Not present. Vag. Bleeding: Other.  Movement: Present. Denies leaking of fluid.   The following portions of the patient's history were reviewed and updated as appropriate: allergies, current medications, past family history, past medical history, past social history, past surgical history and problem list. Problem list updated.  Objective:   Vitals:   04/18/17 1336  BP: 126/63  Pulse: 89  Weight: 144 lb 3.2 oz (65.4 kg)    Fetal Status: Fetal Heart Rate (bpm): 145   Movement: Present     General:  Alert, oriented and cooperative. Patient is in no acute distress.  Skin: Skin is warm and dry. No rash noted.   Cardiovascular: Normal heart rate noted  Respiratory: Normal respiratory effort, no problems with respiration noted  Abdomen: Soft, gravid, appropriate for gestational age. Pain/Pressure: Present     Pelvic:  Cervical exam deferred        MSK: Restriction, tenderness, tissue texture changes, and paraspinal spasm in the left lumbar spine  Neuro: Moves all four extremities with no focal neurological deficit  Extremities: Normal range of motion.  Edema: Trace  Mental Status: Normal mood and affect. Normal behavior. Normal judgment and thought content.   OSE: Head   Cervical     Thoracic   Rib   Lumbar L5 ESRL  Sacrum L/L torsion  Pelvis L posterior innom    Assessment and Plan:  Pregnancy: [id number] at 757w5d  1. Supervision of high risk pregnancy, antepartum FHT and FH normal - HYDROXYprogesterone Caproate SOAJ 275 mg; Inject 275 mg into the skin once.  2. History of preterm delivery, currently pregnant in second trimester Continue makena  3. Cervical cerclage suture present in second trimester  4. Back pain affecting pregnancy in third trimester 5. Somatic dysfunction of lumbar region 6. Somatic dysfunction of sacral region 7. Somatic dysfunction of pelvis region OMT done after patient permission. HVLA technique utilized. Patient tolerated procedure well.    Preterm labor symptoms and general obstetric precautions including but not limited to vaginal bleeding, contractions, leaking of fluid and fetal movement were reviewed in detail with the patient. Please refer to After Visit Summary for other counseling recommendations.  Return in about 2 weeks (around 05/02/2017) for HR OB f/u, back pain .  Levie HeritageStinson, Jailyne Chieffo J, DO

## 2017-04-25 ENCOUNTER — Ambulatory Visit (INDEPENDENT_AMBULATORY_CARE_PROVIDER_SITE_OTHER): Payer: BLUE CROSS/BLUE SHIELD

## 2017-04-25 DIAGNOSIS — O09219 Supervision of pregnancy with history of pre-term labor, unspecified trimester: Principal | ICD-10-CM

## 2017-04-25 DIAGNOSIS — O09213 Supervision of pregnancy with history of pre-term labor, third trimester: Secondary | ICD-10-CM | POA: Diagnosis not present

## 2017-04-25 DIAGNOSIS — O09899 Supervision of other high risk pregnancies, unspecified trimester: Secondary | ICD-10-CM

## 2017-04-25 NOTE — Progress Notes (Signed)
Patient presented to the office as scheduled for 17P injection. Patient tolerated well.

## 2017-04-26 ENCOUNTER — Telehealth: Payer: Self-pay | Admitting: *Deleted

## 2017-04-26 NOTE — Telephone Encounter (Signed)
[facility]ae DireKate at [facility] is calling to schedule shipment of patients makena. Please call [phone].

## 2017-04-30 NOTE — Telephone Encounter (Signed)
Noted we have 4 vials in our cabinet, I called and they stated yes, they had sent it for arrival today.

## 2017-05-02 ENCOUNTER — Ambulatory Visit (INDEPENDENT_AMBULATORY_CARE_PROVIDER_SITE_OTHER): Payer: BLUE CROSS/BLUE SHIELD | Admitting: Family Medicine

## 2017-05-02 VITALS — BP 132/68 | HR 90 | Wt 146.3 lb

## 2017-05-02 DIAGNOSIS — O0993 Supervision of high risk pregnancy, unspecified, third trimester: Secondary | ICD-10-CM

## 2017-05-02 DIAGNOSIS — O99891 Other specified diseases and conditions complicating pregnancy: Secondary | ICD-10-CM

## 2017-05-02 DIAGNOSIS — O26893 Other specified pregnancy related conditions, third trimester: Secondary | ICD-10-CM | POA: Diagnosis not present

## 2017-05-02 DIAGNOSIS — M549 Dorsalgia, unspecified: Secondary | ICD-10-CM

## 2017-05-02 DIAGNOSIS — O09219 Supervision of pregnancy with history of pre-term labor, unspecified trimester: Secondary | ICD-10-CM

## 2017-05-02 DIAGNOSIS — O0990 Supervision of high risk pregnancy, unspecified, unspecified trimester: Secondary | ICD-10-CM

## 2017-05-02 DIAGNOSIS — O3433 Maternal care for cervical incompetence, third trimester: Secondary | ICD-10-CM

## 2017-05-02 DIAGNOSIS — M9903 Segmental and somatic dysfunction of lumbar region: Secondary | ICD-10-CM | POA: Diagnosis not present

## 2017-05-02 DIAGNOSIS — O3432 Maternal care for cervical incompetence, second trimester: Secondary | ICD-10-CM

## 2017-05-02 DIAGNOSIS — M9905 Segmental and somatic dysfunction of pelvic region: Secondary | ICD-10-CM

## 2017-05-02 DIAGNOSIS — O9989 Other specified diseases and conditions complicating pregnancy, childbirth and the puerperium: Secondary | ICD-10-CM

## 2017-05-02 DIAGNOSIS — M9904 Segmental and somatic dysfunction of sacral region: Secondary | ICD-10-CM

## 2017-05-02 DIAGNOSIS — O09213 Supervision of pregnancy with history of pre-term labor, third trimester: Secondary | ICD-10-CM

## 2017-05-02 DIAGNOSIS — O09899 Supervision of other high risk pregnancies, unspecified trimester: Secondary | ICD-10-CM

## 2017-05-02 LAB — POCT URINALYSIS DIP (DEVICE)
Bilirubin Urine: NEGATIVE
Glucose, UA: NEGATIVE mg/dL
HGB URINE DIPSTICK: NEGATIVE
Ketones, ur: NEGATIVE mg/dL
Leukocytes, UA: NEGATIVE
NITRITE: NEGATIVE
PH: 6 (ref 5.0–8.0)
Protein, ur: 30 mg/dL — AB
Specific Gravity, Urine: 1.025 (ref 1.005–1.030)
UROBILINOGEN UA: 0.2 mg/dL (ref 0.0–1.0)

## 2017-05-02 NOTE — Progress Notes (Signed)
   PRENATAL VISIT NOTE  Subjective:  Beth Giles is a 31 y.o. [phone] at 478w5d being seen today for ongoing prenatal care.  She is currently monitored for the following issues for this high-risk pregnancy and has Supervision of high-risk pregnancy; History of preterm delivery, currently pregnant; Tobacco smoking affecting pregnancy in first trimester; Anxiety during pregnancy, antepartum; Short cervical length during pregnancy in second trimester; and Cervical cerclage suture present in second trimester on her problem list.  Patient reports backache. Had less pain for a couple of days after last visit. Still has pain, worse when laying on that side. Contractions: Not present. Vag. Bleeding: None.  Movement: Present. Denies leaking of fluid.   The following portions of the patient's history were reviewed and updated as appropriate: allergies, current medications, past family history, past medical history, past social history, past surgical history and problem list. Problem list updated.  Objective:   Vitals:   05/02/17 1525  BP: 132/68  Pulse: 90  Weight: 146 lb 4.8 oz (66.4 kg)    Fetal Status: Fetal Heart Rate (bpm): 131   Movement: Present     General:  Alert, oriented and cooperative. Patient is in no acute distress.  Skin: Skin is warm and dry. No rash noted.   Cardiovascular: Normal heart rate noted  Respiratory: Normal respiratory effort, no problems with respiration noted  Abdomen: Soft, gravid, appropriate for gestational age. Pain/Pressure: Present     Pelvic:  Cervical exam deferred        MSK: Restriction, tenderness, tissue texture changes, and paraspinal spasm in the lumbar spine  Neuro: Moves all four extremities with no focal neurological deficit  Extremities: Normal range of motion.  Edema: Trace  Mental Status: Normal mood and affect. Normal behavior. Normal judgment and thought content.   OSE: Head   Cervical   Thoracic   Rib   Lumbar   Sacrum   Pelvis      Assessment and Plan:  Pregnancy: [id number] at 4678w5d  1. Supervision of high risk pregnancy, antepartum FHT and FH normal  2. Cervical cerclage suture present in second trimester  3. History of preterm delivery, currently pregnant No symptoms of preterm labor. Continue 17-P  4. Back pain affecting pregnancy in third trimester 5. Somatic dysfunction of lumbar region 6. Somatic dysfunction of sacral region 7. Somatic dysfunction of pelvis region OMT done after patient permission. HVLA technique utilized. Patient tolerated procedure well. Exercises taught.   Preterm labor symptoms and general obstetric precautions including but not limited to vaginal bleeding, contractions, leaking of fluid and fetal movement were reviewed in detail with the patient. Please refer to After Visit Summary for other counseling recommendations.  No Follow-up on file.  Levie HeritageStinson, Verley Pariseau J, DO

## 2017-05-09 ENCOUNTER — Ambulatory Visit: Payer: BLUE CROSS/BLUE SHIELD

## 2017-05-10 ENCOUNTER — Encounter: Payer: Self-pay | Admitting: General Practice

## 2017-05-10 ENCOUNTER — Ambulatory Visit (INDEPENDENT_AMBULATORY_CARE_PROVIDER_SITE_OTHER): Payer: BLUE CROSS/BLUE SHIELD | Admitting: *Deleted

## 2017-05-10 VITALS — BP 135/73 | HR 88

## 2017-05-10 DIAGNOSIS — O09219 Supervision of pregnancy with history of pre-term labor, unspecified trimester: Principal | ICD-10-CM

## 2017-05-10 DIAGNOSIS — O09899 Supervision of other high risk pregnancies, unspecified trimester: Secondary | ICD-10-CM

## 2017-05-10 DIAGNOSIS — O09213 Supervision of pregnancy with history of pre-term labor, third trimester: Secondary | ICD-10-CM | POA: Diagnosis not present

## 2017-05-10 NOTE — Progress Notes (Signed)
Patient presents to clinic for 17-p injection. Tolerated well.

## 2017-05-10 NOTE — Progress Notes (Signed)
IUP confirmed. Encourage to start [facility] and [facility]

## 2017-05-16 ENCOUNTER — Ambulatory Visit (INDEPENDENT_AMBULATORY_CARE_PROVIDER_SITE_OTHER): Payer: BLUE CROSS/BLUE SHIELD | Admitting: Family Medicine

## 2017-05-16 ENCOUNTER — Other Ambulatory Visit (HOSPITAL_COMMUNITY)
Admission: RE | Admit: 2017-05-16 | Discharge: 2017-05-16 | Disposition: A | Discharge status: Home / Self Care | Payer: BLUE CROSS/BLUE SHIELD | Source: Ambulatory Visit | Attending: Family Medicine | Admitting: Family Medicine

## 2017-05-16 VITALS — BP 130/71 | HR 90

## 2017-05-16 DIAGNOSIS — O26872 Cervical shortening, second trimester: Secondary | ICD-10-CM

## 2017-05-16 DIAGNOSIS — Z3A36 36 weeks gestation of pregnancy: Secondary | ICD-10-CM | POA: Diagnosis not present

## 2017-05-16 DIAGNOSIS — O09213 Supervision of pregnancy with history of pre-term labor, third trimester: Secondary | ICD-10-CM | POA: Diagnosis not present

## 2017-05-16 DIAGNOSIS — O0993 Supervision of high risk pregnancy, unspecified, third trimester: Secondary | ICD-10-CM | POA: Insufficient documentation

## 2017-05-16 DIAGNOSIS — O3432 Maternal care for cervical incompetence, second trimester: Secondary | ICD-10-CM

## 2017-05-16 DIAGNOSIS — Z331 Pregnant state, incidental: Secondary | ICD-10-CM

## 2017-05-16 DIAGNOSIS — O26873 Cervical shortening, third trimester: Secondary | ICD-10-CM

## 2017-05-16 DIAGNOSIS — Z113 Encounter for screening for infections with a predominantly sexual mode of transmission: Secondary | ICD-10-CM | POA: Diagnosis not present

## 2017-05-16 DIAGNOSIS — O09893 Supervision of other high risk pregnancies, third trimester: Secondary | ICD-10-CM

## 2017-05-16 MED ORDER — MEDROXYPROGESTERONE ACETATE 150 MG/ML IM SUSP: 150.0000 mg | Freq: Once | INTRAMUSCULAR | Status: DC

## 2017-05-16 MED ORDER — HYDROXYPROGESTERONE CAPROATE 250 MG/ML IM OIL
250.0000 mg | TOPICAL_OIL | Freq: Once | INTRAMUSCULAR | Status: AC
  Administered 2017-05-16: 250 mg via INTRAMUSCULAR

## 2017-05-16 NOTE — Progress Notes (Signed)
   PRENATAL VISIT NOTE  Subjective:  Beth Giles is a 31 y.o. [phone] at 8167w5d being seen today for ongoing prenatal care.  She is currently monitored for the following issues for this high-risk pregnancy and has Supervision of high-risk pregnancy; History of preterm delivery, currently pregnant; Tobacco smoking affecting pregnancy in first trimester; Anxiety during pregnancy, antepartum; Short cervical length during pregnancy in second trimester; and Cervical cerclage suture present in second trimester on her problem list.  Patient reports no complaints.  Contractions: Not present.  .  Movement: Present. Denies leaking of fluid.   The following portions of the patient's history were reviewed and updated as appropriate: allergies, current medications, past family history, past medical history, past social history, past surgical history and problem list. Problem list updated.  Objective:   Vitals:   05/16/17 1536  BP: 130/71  Pulse: 90    Fetal Status: Fetal Heart Rate (bpm): 145 Fundal Height: 36 cm Movement: Present  Presentation: Vertex  General:  Alert, oriented and cooperative. Patient is in no acute distress.  Skin: Skin is warm and dry. No rash noted.   Cardiovascular: Normal heart rate noted  Respiratory: Normal respiratory effort, no problems with respiration noted  Abdomen: Soft, gravid, appropriate for gestational age.  Pain/Pressure: Present     Pelvic: Cervical exam performed Dilation: 2 Effacement (%): 50 Station: -3  Extremities: Normal range of motion.     Mental Status:  Normal mood and affect. Normal behavior. Normal judgment and thought content.   Assessment and Plan:  Pregnancy: [id number] at 8367w5d  1. Supervision of high risk pregnancy in third trimester FHT and FH normal.  - Culture, beta strep (group b only) - GC/Chlamydia probe amp ([place]  2. History of preterm delivery, currently pregnant in third trimester Last Makena given  today - hydroxyprogesterone caproate (MAKENA) 250 mg/mL injection 250 mg; Inject 1 mL (250 mg total) into the muscle once.  3. Cervical cerclage suture present in second trimester Removed without difficulty  4. Short cervical length during pregnancy in second trimester   Preterm labor symptoms and general obstetric precautions including but not limited to vaginal bleeding, contractions, leaking of fluid and fetal movement were reviewed in detail with the patient. Please refer to After Visit Summary for other counseling recommendations.  No Follow-up on file.   Levie HeritageJacob J Stinson, DO

## 2017-05-17 LAB — GC/CHLAMYDIA PROBE AMP (~~LOC~~) NOT AT ARMC
Chlamydia: NEGATIVE
Neisseria Gonorrhea: NEGATIVE

## 2017-05-19 ENCOUNTER — Encounter (HOSPITAL_COMMUNITY): Payer: Self-pay

## 2017-05-19 ENCOUNTER — Encounter (HOSPITAL_COMMUNITY)
Admission: AD | Disposition: A | Discharge status: Home / Self Care | Payer: Self-pay | Source: Ambulatory Visit | Attending: Obstetrics and Gynecology

## 2017-05-19 ENCOUNTER — Inpatient Hospital Stay (HOSPITAL_COMMUNITY): Payer: BLUE CROSS/BLUE SHIELD | Admitting: Anesthesiology

## 2017-05-19 ENCOUNTER — Inpatient Hospital Stay (HOSPITAL_COMMUNITY)
Admission: AD | Admit: 2017-05-19 | Discharge: 2017-05-22 | DRG: 765 | Disposition: A | Discharge status: Home / Self Care | Payer: BLUE CROSS/BLUE SHIELD | Source: Ambulatory Visit | Attending: Obstetrics and Gynecology | Admitting: Obstetrics and Gynecology

## 2017-05-19 DIAGNOSIS — O321XX0 Maternal care for breech presentation, not applicable or unspecified: Principal | ICD-10-CM | POA: Diagnosis present

## 2017-05-19 DIAGNOSIS — Z3A37 37 weeks gestation of pregnancy: Secondary | ICD-10-CM

## 2017-05-19 DIAGNOSIS — O26873 Cervical shortening, third trimester: Secondary | ICD-10-CM | POA: Diagnosis present

## 2017-05-19 DIAGNOSIS — Z880 Allergy status to penicillin: Secondary | ICD-10-CM

## 2017-05-19 DIAGNOSIS — O09219 Supervision of pregnancy with history of pre-term labor, unspecified trimester: Secondary | ICD-10-CM

## 2017-05-19 DIAGNOSIS — O99331 Smoking (tobacco) complicating pregnancy, first trimester: Secondary | ICD-10-CM

## 2017-05-19 DIAGNOSIS — O4292 Full-term premature rupture of membranes, unspecified as to length of time between rupture and onset of labor: Secondary | ICD-10-CM | POA: Diagnosis present

## 2017-05-19 DIAGNOSIS — Z23 Encounter for immunization: Secondary | ICD-10-CM | POA: Diagnosis not present

## 2017-05-19 DIAGNOSIS — O43813 Placental infarction, third trimester: Secondary | ICD-10-CM | POA: Diagnosis not present

## 2017-05-19 DIAGNOSIS — O09899 Supervision of other high risk pregnancies, unspecified trimester: Secondary | ICD-10-CM

## 2017-05-19 DIAGNOSIS — O4202 Full-term premature rupture of membranes, onset of labor within 24 hours of rupture: Secondary | ICD-10-CM | POA: Diagnosis not present

## 2017-05-19 DIAGNOSIS — Z87891 Personal history of nicotine dependence: Secondary | ICD-10-CM

## 2017-05-19 DIAGNOSIS — Z98891 History of uterine scar from previous surgery: Secondary | ICD-10-CM

## 2017-05-19 DIAGNOSIS — O0993 Supervision of high risk pregnancy, unspecified, third trimester: Secondary | ICD-10-CM

## 2017-05-19 DIAGNOSIS — O26872 Cervical shortening, second trimester: Secondary | ICD-10-CM

## 2017-05-19 LAB — TYPE AND SCREEN
ABO/RH(D): AB POS
ANTIBODY SCREEN: NEGATIVE

## 2017-05-19 LAB — ABO/RH: ABO/RH(D): AB POS

## 2017-05-19 LAB — CBC
HEMATOCRIT: 33.4 % — AB (ref 36.0–46.0)
HEMOGLOBIN: 11.3 g/dL — AB (ref 12.0–15.0)
MCH: 34.3 pg — ABNORMAL HIGH (ref 26.0–34.0)
MCHC: 33.8 g/dL (ref 30.0–36.0)
MCV: 101.5 fL — ABNORMAL HIGH (ref 78.0–100.0)
Platelets: 351 10*3/uL (ref 150–400)
RBC: 3.29 MIL/uL — AB (ref 3.87–5.11)
RDW: 12.9 % (ref 11.5–15.5)
WBC: 11.8 10*3/uL — ABNORMAL HIGH (ref 4.0–10.5)

## 2017-05-19 LAB — RPR: RPR: NONREACTIVE

## 2017-05-19 SURGERY — Surgical Case
Anesthesia: Spinal

## 2017-05-19 MED ORDER — DEXAMETHASONE SODIUM PHOSPHATE 10 MG/ML IJ SOLN
INTRAMUSCULAR | Status: DC | PRN
  Administered 2017-05-19: 10 mg via INTRAVENOUS

## 2017-05-19 MED ORDER — NALBUPHINE HCL 10 MG/ML IJ SOLN
5.0000 mg | INTRAMUSCULAR | Status: DC | PRN
  Administered 2017-05-19: 5 mg via INTRAVENOUS
  Filled 2017-05-19: qty 1

## 2017-05-19 MED ORDER — SCOPOLAMINE 1 MG/3DAYS TD PT72
MEDICATED_PATCH | TRANSDERMAL | Status: AC
  Filled 2017-05-19: qty 1

## 2017-05-19 MED ORDER — BUPIVACAINE IN DEXTROSE 0.75-8.25 % IT SOLN
INTRATHECAL | Status: DC | PRN
  Administered 2017-05-19: 10.5 mg via INTRATHECAL

## 2017-05-19 MED ORDER — SENNOSIDES-DOCUSATE SODIUM 8.6-50 MG PO TABS
2.0000 | ORAL_TABLET | ORAL | Status: DC
  Administered 2017-05-19 – 2017-05-21 (×3): 2 via ORAL
  Filled 2017-05-19 (×3): qty 2

## 2017-05-19 MED ORDER — PHENYLEPHRINE 40 MCG/ML (10ML) SYRINGE FOR IV PUSH (FOR BLOOD PRESSURE SUPPORT): PREFILLED_SYRINGE | INTRAVENOUS | Status: DC | PRN

## 2017-05-19 MED ORDER — GENTAMICIN SULFATE 40 MG/ML IJ SOLN
Freq: Once | INTRAMUSCULAR | Status: AC
  Administered 2017-05-19: 06:00:00 via INTRAVENOUS
  Filled 2017-05-19: qty 8.5

## 2017-05-19 MED ORDER — DIPHENHYDRAMINE HCL 25 MG PO CAPS: 25.0000 mg | ORAL_CAPSULE | Freq: Four times a day (QID) | ORAL | Status: DC | PRN

## 2017-05-19 MED ORDER — OXYCODONE HCL 5 MG PO TABS: 5.0000 mg | ORAL_TABLET | ORAL | Status: DC | PRN

## 2017-05-19 MED ORDER — KETOROLAC TROMETHAMINE 30 MG/ML IJ SOLN
30.0000 mg | Freq: Once | INTRAMUSCULAR | Status: DC | PRN
  Administered 2017-05-19: 30 mg via INTRAVENOUS

## 2017-05-19 MED ORDER — GENTAMICIN SULFATE 40 MG/ML IJ SOLN: 5.0000 mg/kg | INTRAMUSCULAR | Status: DC

## 2017-05-19 MED ORDER — MORPHINE SULFATE (PF) 0.5 MG/ML IJ SOLN
INTRAMUSCULAR | Status: DC | PRN
  Administered 2017-05-19: .2 mg via INTRATHECAL

## 2017-05-19 MED ORDER — IBUPROFEN 600 MG PO TABS
600.0000 mg | ORAL_TABLET | Freq: Four times a day (QID) | ORAL | Status: DC
  Administered 2017-05-19 – 2017-05-22 (×12): 600 mg via ORAL
  Filled 2017-05-19 (×13): qty 1

## 2017-05-19 MED ORDER — ONDANSETRON HCL 4 MG/2ML IJ SOLN
INTRAMUSCULAR | Status: DC | PRN
  Administered 2017-05-19: 4 mg via INTRAVENOUS

## 2017-05-19 MED ORDER — SOD CITRATE-CITRIC ACID 500-334 MG/5ML PO SOLN
30.0000 mL | Freq: Once | ORAL | Status: AC
  Administered 2017-05-19: 30 mL via ORAL
  Filled 2017-05-19: qty 15

## 2017-05-19 MED ORDER — WITCH HAZEL-GLYCERIN EX PADS: 1.0000 "application " | MEDICATED_PAD | CUTANEOUS | Status: DC | PRN

## 2017-05-19 MED ORDER — TETANUS-DIPHTH-ACELL PERTUSSIS 5-2.5-18.5 LF-MCG/0.5 IM SUSP: 0.5000 mL | Freq: Once | INTRAMUSCULAR | Status: DC

## 2017-05-19 MED ORDER — SCOPOLAMINE 1 MG/3DAYS TD PT72
1.0000 | MEDICATED_PATCH | TRANSDERMAL | Status: DC
  Administered 2017-05-19: 1.5 mg via TRANSDERMAL
  Filled 2017-05-19 (×2): qty 1

## 2017-05-19 MED ORDER — DIPHENHYDRAMINE HCL 25 MG PO CAPS
25.0000 mg | ORAL_CAPSULE | ORAL | Status: DC | PRN
  Filled 2017-05-19: qty 1

## 2017-05-19 MED ORDER — MENTHOL 3 MG MT LOZG
1.0000 | LOZENGE | OROMUCOSAL | Status: DC | PRN
  Filled 2017-05-19: qty 9

## 2017-05-19 MED ORDER — OXYCODONE HCL 5 MG PO TABS: 10.0000 mg | ORAL_TABLET | ORAL | Status: DC | PRN

## 2017-05-19 MED ORDER — OXYTOCIN 10 UNIT/ML IJ SOLN
INTRAMUSCULAR | Status: AC
Start: 2017-05-19 — End: ?
  Filled 2017-05-19: qty 4

## 2017-05-19 MED ORDER — BUPIVACAINE IN DEXTROSE 0.75-8.25 % IT SOLN
INTRATHECAL | Status: AC
  Filled 2017-05-19: qty 2

## 2017-05-19 MED ORDER — DIPHENHYDRAMINE HCL 50 MG/ML IJ SOLN
INTRAMUSCULAR | Status: AC
  Administered 2017-05-19: 12.5 mg via INTRAVENOUS
  Filled 2017-05-19: qty 1

## 2017-05-19 MED ORDER — FENTANYL CITRATE (PF) 100 MCG/2ML IJ SOLN
INTRAMUSCULAR | Status: AC
  Filled 2017-05-19: qty 2

## 2017-05-19 MED ORDER — DIPHENHYDRAMINE HCL 50 MG/ML IJ SOLN
12.5000 mg | INTRAMUSCULAR | Status: DC | PRN
  Administered 2017-05-19: 12.5 mg via INTRAVENOUS

## 2017-05-19 MED ORDER — TRAMADOL HCL 50 MG PO TABS
50.0000 mg | ORAL_TABLET | Freq: Four times a day (QID) | ORAL | Status: DC | PRN
  Administered 2017-05-20 – 2017-05-21 (×3): 50 mg via ORAL
  Filled 2017-05-19 (×3): qty 1

## 2017-05-19 MED ORDER — SCOPOLAMINE 1 MG/3DAYS TD PT72: 1.0000 | MEDICATED_PATCH | Freq: Once | TRANSDERMAL | Status: DC

## 2017-05-19 MED ORDER — PHENYLEPHRINE 8 MG IN D5W 100 ML (0.08MG/ML) PREMIX OPTIME
INJECTION | INTRAVENOUS | Status: AC
  Filled 2017-05-19: qty 100

## 2017-05-19 MED ORDER — PHENYLEPHRINE 8 MG IN D5W 100 ML (0.08MG/ML) PREMIX OPTIME
INJECTION | INTRAVENOUS | Status: DC | PRN
  Administered 2017-05-19: 60 ug/min via INTRAVENOUS

## 2017-05-19 MED ORDER — COCONUT OIL OIL: 1.0000 "application " | TOPICAL_OIL | Status: DC | PRN

## 2017-05-19 MED ORDER — PRENATAL MULTIVITAMIN CH
1.0000 | ORAL_TABLET | Freq: Every day | ORAL | Status: DC
  Administered 2017-05-20 – 2017-05-22 (×3): 1 via ORAL
  Filled 2017-05-19 (×3): qty 1

## 2017-05-19 MED ORDER — ACETAMINOPHEN 325 MG PO TABS
650.0000 mg | ORAL_TABLET | ORAL | Status: DC | PRN
  Administered 2017-05-19 – 2017-05-21 (×5): 650 mg via ORAL
  Filled 2017-05-19 (×5): qty 2

## 2017-05-19 MED ORDER — NALBUPHINE HCL 10 MG/ML IJ SOLN
5.0000 mg | INTRAMUSCULAR | Status: DC | PRN
  Administered 2017-05-19: 5 mg via SUBCUTANEOUS
  Filled 2017-05-19: qty 1

## 2017-05-19 MED ORDER — NALBUPHINE HCL 10 MG/ML IJ SOLN: 5.0000 mg | Freq: Once | INTRAMUSCULAR | Status: DC | PRN

## 2017-05-19 MED ORDER — TERBUTALINE SULFATE 1 MG/ML IJ SOLN
0.2500 mg | Freq: Once | INTRAMUSCULAR | Status: AC
  Administered 2017-05-19: 0.25 mg via SUBCUTANEOUS
  Filled 2017-05-19: qty 1

## 2017-05-19 MED ORDER — LACTATED RINGERS IV SOLN
INTRAVENOUS | Status: DC | PRN
  Administered 2017-05-19 (×2): via INTRAVENOUS

## 2017-05-19 MED ORDER — NALOXONE HCL 2 MG/2ML IJ SOSY
1.0000 ug/kg/h | PREFILLED_SYRINGE | INTRAVENOUS | Status: DC | PRN
  Filled 2017-05-19: qty 2

## 2017-05-19 MED ORDER — SODIUM CHLORIDE 0.9% FLUSH: 3.0000 mL | INTRAVENOUS | Status: DC | PRN

## 2017-05-19 MED ORDER — SIMETHICONE 80 MG PO CHEW
80.0000 mg | CHEWABLE_TABLET | Freq: Three times a day (TID) | ORAL | Status: DC
  Administered 2017-05-19 – 2017-05-22 (×8): 80 mg via ORAL
  Filled 2017-05-19 (×10): qty 1

## 2017-05-19 MED ORDER — ONDANSETRON HCL 4 MG/2ML IJ SOLN: 4.0000 mg | Freq: Three times a day (TID) | INTRAMUSCULAR | Status: DC | PRN

## 2017-05-19 MED ORDER — LACTATED RINGERS IV BOLUS (SEPSIS): 1000.0000 mL | Freq: Once | INTRAVENOUS | Status: DC

## 2017-05-19 MED ORDER — SIMETHICONE 80 MG PO CHEW
80.0000 mg | CHEWABLE_TABLET | ORAL | Status: DC
  Administered 2017-05-19 – 2017-05-21 (×3): 80 mg via ORAL
  Filled 2017-05-19 (×3): qty 1

## 2017-05-19 MED ORDER — NALOXONE HCL 0.4 MG/ML IJ SOLN: 0.4000 mg | INTRAMUSCULAR | Status: DC | PRN

## 2017-05-19 MED ORDER — MORPHINE SULFATE (PF) 0.5 MG/ML IJ SOLN
INTRAMUSCULAR | Status: AC
  Filled 2017-05-19: qty 10

## 2017-05-19 MED ORDER — OXYTOCIN 40 UNITS IN LACTATED RINGERS INFUSION - SIMPLE MED: 2.5000 [IU]/h | INTRAVENOUS | Status: AC

## 2017-05-19 MED ORDER — SIMETHICONE 80 MG PO CHEW
80.0000 mg | CHEWABLE_TABLET | ORAL | Status: DC | PRN
  Administered 2017-05-20: 80 mg via ORAL
  Filled 2017-05-19: qty 1

## 2017-05-19 MED ORDER — LACTATED RINGERS IV SOLN
INTRAVENOUS | Status: DC | PRN
  Administered 2017-05-19: 07:00:00 via INTRAVENOUS

## 2017-05-19 MED ORDER — DEXAMETHASONE SODIUM PHOSPHATE 10 MG/ML IJ SOLN
INTRAMUSCULAR | Status: AC
  Filled 2017-05-19: qty 1

## 2017-05-19 MED ORDER — HYDROMORPHONE HCL 1 MG/ML IJ SOLN: 0.2500 mg | INTRAMUSCULAR | Status: DC | PRN

## 2017-05-19 MED ORDER — ONDANSETRON HCL 4 MG/2ML IJ SOLN
INTRAMUSCULAR | Status: AC
  Filled 2017-05-19: qty 2

## 2017-05-19 MED ORDER — CLINDAMYCIN PHOSPHATE 900 MG/50ML IV SOLN: 900.0000 mg | INTRAVENOUS | Status: DC

## 2017-05-19 MED ORDER — LACTATED RINGERS IV SOLN
INTRAVENOUS | Status: DC
  Administered 2017-05-19 (×2): via INTRAVENOUS

## 2017-05-19 MED ORDER — LACTATED RINGERS IV SOLN: INTRAVENOUS | Status: DC

## 2017-05-19 MED ORDER — FENTANYL CITRATE (PF) 100 MCG/2ML IJ SOLN
INTRAMUSCULAR | Status: DC | PRN
  Administered 2017-05-19: 10 ug via INTRATHECAL

## 2017-05-19 MED ORDER — KETOROLAC TROMETHAMINE 30 MG/ML IJ SOLN
INTRAMUSCULAR | Status: AC
Start: 2017-05-19 — End: 2017-05-19
  Filled 2017-05-19: qty 1

## 2017-05-19 MED ORDER — DIBUCAINE 1 % RE OINT: 1.0000 "application " | TOPICAL_OINTMENT | RECTAL | Status: DC | PRN

## 2017-05-19 MED ORDER — LACTATED RINGERS IV SOLN
INTRAVENOUS | Status: DC | PRN
  Administered 2017-05-19: 40 [IU] via INTRAVENOUS

## 2017-05-19 SURGICAL SUPPLY — 31 items
BENZOIN TINCTURE PRP APPL 2/3 (GAUZE/BANDAGES/DRESSINGS) ×2 IMPLANT
CHLORAPREP W/TINT 26ML (MISCELLANEOUS) ×2 IMPLANT
CLAMP CORD UMBIL (MISCELLANEOUS) IMPLANT
CLOSURE STERI STRIP 1/2 X4 (GAUZE/BANDAGES/DRESSINGS) ×2 IMPLANT
CONTAINER PREFILL 10% NBF 15ML (MISCELLANEOUS) IMPLANT
DRSG OPSITE POSTOP 4X10 (GAUZE/BANDAGES/DRESSINGS) ×2 IMPLANT
ELECT REM PT RETURN 9FT ADLT (ELECTROSURGICAL) ×2
ELECTRODE REM PT RTRN 9FT ADLT (ELECTROSURGICAL) ×1 IMPLANT
EXTRACTOR VACUUM M CUP 4 TUBE (SUCTIONS) IMPLANT
GLOVE BIOGEL PI IND STRL 6.5 (GLOVE) ×1 IMPLANT
GLOVE BIOGEL PI IND STRL 7.0 (GLOVE) ×1 IMPLANT
GLOVE BIOGEL PI IND STRL 7.5 (GLOVE) ×1 IMPLANT
GLOVE BIOGEL PI INDICATOR 6.5 (GLOVE) ×1
GLOVE BIOGEL PI INDICATOR 7.0 (GLOVE) ×1
GLOVE BIOGEL PI INDICATOR 7.5 (GLOVE) ×1
GLOVE ECLIPSE 8.0 STRL XLNG CF (GLOVE) ×2 IMPLANT
GLOVE SURG SS PI 6.0 STRL IVOR (GLOVE) ×2 IMPLANT
GOWN STRL REUS W/TWL LRG LVL3 (GOWN DISPOSABLE) ×4 IMPLANT
KIT ABG SYR 3ML LUER SLIP (SYRINGE) IMPLANT
NEEDLE HYPO 25X5/8 SAFETYGLIDE (NEEDLE) IMPLANT
NS IRRIG 1000ML POUR BTL (IV SOLUTION) ×2 IMPLANT
PACK C SECTION WH (CUSTOM PROCEDURE TRAY) ×2 IMPLANT
PAD OB MATERNITY 4.3X12.25 (PERSONAL CARE ITEMS) ×2 IMPLANT
PENCIL SMOKE EVAC W/HOLSTER (ELECTROSURGICAL) ×2 IMPLANT
RTRCTR C-SECT PINK 25CM LRG (MISCELLANEOUS) IMPLANT
SEPRAFILM MEMBRANE 5X6 (MISCELLANEOUS) IMPLANT
SUT PLAIN 0 NONE (SUTURE) IMPLANT
SUT VIC AB 0 CT1 36 (SUTURE) ×8 IMPLANT
SUT VIC AB 4-0 KS 27 (SUTURE) ×2 IMPLANT
TOWEL OR 17X24 6PK STRL BLUE (TOWEL DISPOSABLE) ×2 IMPLANT
TRAY FOLEY BAG SILVER LF 14FR (SET/KITS/TRAYS/PACK) ×2 IMPLANT

## 2017-05-19 NOTE — Transfer of Care (Signed)
Immediate Anesthesia Transfer of Care Note  Patient: Brandace Dattilo  Procedure(s) Performed: Procedure(s): CESAREAN SECTION (N/A)  Patient Location: PACU  Anesthesia Type:Spinal  Level of Consciousness: awake, alert  and oriented  Airway & Oxygen Therapy: Patient Spontanous Breathing  Post-op Assessment: Report given to RN and Post -op Vital signs reviewed and stable  Post vital signs: Reviewed and stable  Last Vitals:  Vitals:   05/19/17 0505 05/19/17 0510  BP:    Pulse:    Resp:    Temp:    SpO2: 100% 100%    Last Pain:  Vitals:   05/19/17 0610  TempSrc:   PainSc: 2       Patients Stated Pain Goal: 0 (05/19/17 0610)  Complications: No apparent anesthesia complications

## 2017-05-19 NOTE — Lactation Note (Signed)
This note was copied from a baby's chart. Lactation Consultation Note  Patient Name: Beth Giles DVVOH'Y Date: 05/19/2017 Reason for consult:  (change of feeding intention)  Lactation visit at 10 hours of life; Mom stated she has chosen to formula feed only.  [name] [facility] 05/19/2017, 5:56 PM

## 2017-05-19 NOTE — MAU Note (Signed)
Sarah RN in OR for BS and Dr Jairo Ben notified of pt's plan of care for C/S. Orders received from Dr Jean Rosenthal

## 2017-05-19 NOTE — Anesthesia Procedure Notes (Signed)
Spinal  Patient location during procedure: OB End time: 05/19/2017 6:44 AM Staffing Anesthesiologist: Jairo Ben Performed: anesthesiologist  Preanesthetic Checklist Completed: patient identified, surgical consent, pre-op evaluation, timeout performed, IV checked, risks and benefits discussed and monitors and equipment checked Spinal Block Patient position: sitting Prep: site prepped and draped and DuraPrep Patient monitoring: blood pressure, continuous pulse ox, cardiac monitor and heart rate Approach: midline Location: L3-4 Injection technique: single-shot Needle Needle type: Pencan  Needle gauge: 24 G Needle length: 9 cm Assessment Events: paresthesia Additional Notes Pt identified in Operating room.  Monitors applied. Working IV access confirmed. Sterile prep, drape lumbar spine.  1% lido local L 3,4.  #24ga Pencan into clear CSF L 3,4, transient paresthesia to L leg, needle withdrawn, repeat 324 ga Pencan into clear CSF, asymptomatic and 10.5 mg 0.75% Bupivacaine with dextrose injected with asp CSF beginning and end of injection.  Patient asymptomatic, VSS, no heme aspirated, tolerated well.  Sandford Craze, MD

## 2017-05-19 NOTE — H&P (Signed)
Beth Giles is a 31 y.o. female [phone] at [redacted]w[redacted]d presenting for evaluation of PPROM around 3am. Patient report a green fluid. Patient reports irregular contractions. She reports good fetal movement and denies vaginal bleeding. Patient with prenatal care at [facility] complicated by a previous preterm delivery and a short cervix treated with a cerclage and weekly 17-P. Cerclage was removed on 8/16  OB History    Gravida Para Term Preterm AB Living   3 1 0 1 1 1    SAB TAB Ectopic Multiple Live Births   0 1 0 0 1     Past Medical History:  Diagnosis Date  . Anxiety   . Heart murmur    never has caused any problems  . Termination of pregnancy (fetus)    x1   Past Surgical History:  Procedure Laterality Date  . CERVICAL CERCLAGE N/A 12/31/2016   Procedure: CERCLAGE CERVICAL;  Surgeon: Adam Phenix, MD;  Location: [facility];  Service: Gynecology;  Laterality: N/A;  . WISDOM TOOTH EXTRACTION     Family History: family history includes Diabetes in her mother; Hypertension in her father, mother, and sister. Social History:  reports that she quit smoking about 5 months ago. Her smoking use included Cigarettes. She has a 3.00 pack-year smoking history. She has never used smokeless tobacco. She reports that she does not drink alcohol or use drugs.     Maternal Diabetes: No Genetic Screening: Normal Maternal Ultrasounds/Referrals: Normal Fetal Ultrasounds or other Referrals:  None Maternal Substance Abuse:  No Significant Maternal Medications:  None Significant Maternal Lab Results:  None Other Comments:  None  ROS  See pertinent in HPI History Dilation: 5 Effacement (%): 90 Station: -2 Exam by:: benji Forensic scientist Blood pressure 134/74, pulse 92, temperature 97.9 F (36.6 C), temperature source Oral, resp. rate 18, last menstrual period 09/01/2016, SpO2 100 %. Exam Physical Exam  GENERAL: Well-developed, well-nourished female in no acute distress.  LUNGS: Clear to auscultation  bilaterally.  HEART: Regular rate and rhythm. ABDOMEN: Soft, nontender, nondistended. gravid PELVIC: Normal external female genitalia. Grossly ruptured, meconium stained fluid. Cervical exam per RN EXTREMITIES: No cyanosis, clubbing, or edema, 2+ distal pulses.  FHT: baselline 130, mod variability, + accels, no decels Toco: contractions 2-5 minutes Prenatal labs: ABO, Rh: AB/Positive/-- (03/01 0000) Antibody: Negative (03/01 0000) Rubella: Immune (03/01 0000) RPR: Non Reactive (06/20 0946)  HBsAg: Negative (03/01 0000)  HIV:   negative GBS:  pending   Assessment/Plan: 31 yo [id number] at [redacted]w[redacted]d with PROM, meconium stained fluid - Fetal malpresentation noted on ultrasound- plan for primary c-section - Risks, benefits and alternatives explained including but not limited to risks of bleeding, infection and damage to adjacent organs. Patient verbalized understanding - Patient expressed a desire for BTL. She has Medicaid as secondary. Medicaid form not signed in the past 30 days. Patient declined to have procedure done due to financial concerns - OR notified. Patient prepped - Tocolysis with terbutaline until OR   Fabian Coca 05/19/2017, 5:37 AM

## 2017-05-19 NOTE — Anesthesia Preprocedure Evaluation (Addendum)
Anesthesia Evaluation  Patient identified by MRN, date of birth, ID band Patient awake    Reviewed: Allergy & Precautions, NPO status , Patient's Chart, lab work & pertinent test results  History of Anesthesia Complications Negative for: history of anesthetic complications  Airway Mallampati: I  TM Distance: >3 FB Neck ROM: Full    Dental  (+) Dental Advisory Given   Pulmonary former smoker,    breath sounds clear to auscultation       Cardiovascular negative cardio ROS   Rhythm:Regular Rate:Normal     Neuro/Psych Anxiety negative neurological ROS     GI/Hepatic negative GI ROS, Neg liver ROS,   Endo/Other  negative endocrine ROS  Renal/GU negative Renal ROS     Musculoskeletal   Abdominal   Peds  Hematology plt 351K   Anesthesia Other Findings   Reproductive/Obstetrics (+) Pregnancy                            Anesthesia Physical Anesthesia Plan  ASA: II  Anesthesia Plan: Spinal   Post-op Pain Management:    Induction:   PONV Risk Score and Plan: 3 and Ondansetron, Treatment may vary due to age or medical condition and Scopolamine patch - Pre-op  Airway Management Planned: Natural Airway  Additional Equipment:   Intra-op Plan:   Post-operative Plan:   Informed Consent: I have reviewed the patients History and Physical, chart, labs and discussed the procedure including the risks, benefits and alternatives for the proposed anesthesia with the patient or authorized representative who has indicated his/her understanding and acceptance.   Dental advisory given  Plan Discussed with: CRNA and Surgeon  Anesthesia Plan Comments: (Plan routine monitors, SAB)       Anesthesia Quick Evaluation

## 2017-05-19 NOTE — Anesthesia Postprocedure Evaluation (Signed)
Anesthesia Post Note  Patient: Beth Giles  Procedure(s) Performed: Procedure(s) (LRB): CESAREAN SECTION (N/A)     Patient location during evaluation: PACU Anesthesia Type: Spinal Level of consciousness: awake and alert Pain management: pain level controlled Vital Signs Assessment: post-procedure vital signs reviewed and stable Respiratory status: spontaneous breathing and respiratory function stable Cardiovascular status: blood pressure returned to baseline and stable Postop Assessment: spinal receding Anesthetic complications: no    Last Vitals:  Vitals:   05/19/17 1215 05/19/17 1430  BP: 111/68 (!) 115/55  Pulse: 77 84  Resp: 20 18  Temp: 36.7 C 36.8 C  SpO2: 100% 99%    Last Pain:  Vitals:   05/19/17 1430  TempSrc:   PainSc: 2    Pain Goal: Patients Stated Pain Goal: 0 (05/19/17 0610)               Lewie Loron

## 2017-05-19 NOTE — Op Note (Signed)
Dwan Bolt PROCEDURE DATE: 05/19/2017  PREOPERATIVE DIAGNOSIS: Intrauterine pregnancy at  [redacted]w[redacted]d weeks gestation; PROM and malpresentation: breech  POSTOPERATIVE DIAGNOSIS: The same  PROCEDURE:     Cesarean Section  SURGEON:  Dr. Gigi Gin Elex Mainwaring  ASSISTANT: none  INDICATIONS: Beth Giles is a 31 y.o. [id number] at [redacted]w[redacted]d with PROM scheduled for cesarean section secondary to malpresentation: breech.  The risks of cesarean section discussed with the patient included but were not limited to: bleeding which may require transfusion or reoperation; infection which may require antibiotics; injury to bowel, bladder, ureters or other surrounding organs; injury to the fetus; need for additional procedures including hysterectomy in the event of a life-threatening hemorrhage; placental abnormalities wth subsequent pregnancies, incisional problems, thromboembolic phenomenon and other postoperative/anesthesia complications. The patient concurred with the proposed plan, giving informed written consent for the procedure.    FINDINGS:  Viable female infant in cephalic presentation.  Apgars 9 and 9.  Thin meconium stained amniotic fluid.  Intact placenta, three vessel cord.  Normal uterus, fallopian tubes and ovaries bilaterally.  ANESTHESIA:    Spinal INTRAVENOUS FLUIDS:2300 ml ESTIMATED BLOOD LOSS: 620 ml URINE OUTPUT:  200 ml SPECIMENS: Placenta sent to pathology/L&D COMPLICATIONS: None immediate  PROCEDURE IN DETAIL:  The patient received intravenous antibiotics and had sequential compression devices applied to her lower extremities while in the preoperative area.  She was then taken to the operating room where anesthesia was induced and was found to be adequate. A foley catheter was placed into her bladder and attached to Elio Haden gravity. She was then placed in a dorsal supine position with a leftward tilt, and prepped and draped in a sterile manner. After an adequate timeout was performed, a  Pfannenstiel skin incision was made with scalpel and carried through to the underlying layer of fascia. The fascia was incised in the midline and this incision was extended bilaterally using the Mayo scissors. Kocher clamps were applied to the superior aspect of the fascial incision and the underlying rectus muscles were dissected off bluntly. A similar process was carried out on the inferior aspect of the facial incision. The rectus muscles were separated in the midline bluntly and the peritoneum was entered bluntly. The Alexis self-retaining retractor was introduced into the abdominal cavity. Attention was turned to the lower uterine segment where a transverse hysterotomy was made with a scalpel and extended bilaterally bluntly. The infant was successfully delivered, delayed cord clamping was performed for 1 minute and cord was clamped and cut and infant was handed over to awaiting neonatology team. Uterine massage was then administered and the placenta delivered intact with three-vessel cord. The uterus was cleared of clot and debris.  The hysterotomy was closed with 0 Vicryl in a running locked fashion, and an imbricating layer was also placed with a 0 Vicryl. Overall, excellent hemostasis was noted. The pelvis copiously irrigated and cleared of all clot and debris. Hemostasis was confirmed on all surfaces.  The peritoneum and the muscles were reapproximated using 0 vicryl interrupted stitches. The fascia was then closed using 0 Vicryl in a running fashion.  The skin was closed in a subcuticular fashion using 3.0 Vicryl. The patient tolerated the procedure well. Sponge, lap, instrument and needle counts were correct x 2. She was taken to the recovery room in stable condition.    Travin Marik ConstantMD  05/19/2017 7:32 AM

## 2017-05-19 NOTE — MAU Note (Signed)
Water broke at 0255. Green fluid. No pain. Baby moving well.

## 2017-05-20 LAB — CBC
HEMATOCRIT: 25 % — AB (ref 36.0–46.0)
Hemoglobin: 8.4 g/dL — ABNORMAL LOW (ref 12.0–15.0)
MCH: 34.7 pg — ABNORMAL HIGH (ref 26.0–34.0)
MCHC: 33.6 g/dL (ref 30.0–36.0)
MCV: 103.3 fL — ABNORMAL HIGH (ref 78.0–100.0)
Platelets: 301 10*3/uL (ref 150–400)
RBC: 2.42 MIL/uL — ABNORMAL LOW (ref 3.87–5.11)
RDW: 13 % (ref 11.5–15.5)
WBC: 14.7 10*3/uL — ABNORMAL HIGH (ref 4.0–10.5)

## 2017-05-20 LAB — CULTURE, BETA STREP (GROUP B ONLY): Strep Gp B Culture: NEGATIVE

## 2017-05-20 NOTE — Progress Notes (Signed)
Post OP DAY #1 Subjective: no complaints, up ad lib and voiding  Objective: Blood pressure (!) 102/46, pulse 74, temperature 98.2 F (36.8 C), temperature source Oral, resp. rate 18, height 5\' 2"  (1.575 m), weight 68.5 kg (151 lb), last menstrual period 09/01/2016, SpO2 100 %, unknown if currently breastfeeding.  Physical Exam:  General: alert Lochia: appropriate Uterine Fundus: firm and appropriately tender at U Honeycomb dressing: c/d/i DVT Evaluation: No evidence of DVT seen on physical exam.   Recent Labs  05/19/17 0525 05/20/17 0511  HGB 11.3* 8.4*  HCT 33.4* 25.0*    Assessment/Plan: Plan for discharge tomorrow   LOS: 1 day   Treniyah Lynn C Carren Blakley 05/20/2017, 8:02 AM

## 2017-05-20 NOTE — Clinical Social Work Maternal (Signed)
CLINICAL SOCIAL WORK MATERNAL/CHILD NOTE  Patient Details  Name: Beth Giles MRN: [id number] Date of Birth: April 23, 1986  Date:  05/20/2017  Clinical Social Worker Initiating Note:  Laurey Arrow Date/ Time Initiated:  05/20/17/1202     Child's Name:  Beth Giles   Legal Guardian:  Mother (FOB is not going to be involved ([place]).)   Need for Interpreter:  None   Date of Referral:  05/19/17     Reason for Referral:  Behavioral Health Issues, including SI  (HX of PPD and 10 on EPDS)   Referral Source:  Central Nursery   Address:  [address] [place] [address]  Phone number:  [phone]   Household Members:  Self, Minor Children (MOB's oldest son is 55 years old. )   Natural Supports (not living in the home):  Immediate Family, Friends, Extended Family (MOB's work family will also provide support. )   Professional Supports: Transport planner (Referral made for counsling with [facility] ([place]).)   Employment: Full-time   Type of Work: Therapist, art   Education:  Engineer, maintenance Resources:  Multimedia programmer   Other Resources:  [facility]   Cultural/Religious Considerations Which May Impact Care:  Per [employer], MOB is [employer].   Strengths:  Ability to meet basic needs , Pediatrician chosen , Understanding of illness, Home prepared for child    Risk Factors/Current Problems:  Mental Health Concerns    Cognitive State:  Able to Concentrate , Alert , Linear Thinking , Insightful , Goal Oriented    Mood/Affect:  Calm , Flat , Anxious , Interested , Comfortable    CSW Assessment: CSW met with MOB to complete an assessment for MH hx and 10 on EPDS.  When CSW arrive, MOB was attaching and bonding with infant as evident by engaging in skin to skin.  Throughout the assessment, MOB was attentive to infant's needs and responded appropriately to infant's cues. MOB appeared flat, but was forthcoming and receptive to  meeting with CSW. CSW explained CSW's role and encouraged MOB to ask questions.     CSW inquired about MOB's supports and MOB communicated that MOB does not have any biological family local but has a great support through MOB's work family.  MOB reported that MOB's pregnancy was not planned and FOB will not be involved with infant. MOB expressed feeling prepared to be a single parent and feeling financial stable to provide for MOB's children. CSW praised MOB for being prepared and offered MOB community parenting supports; MOB declined.   CSW also asked about MOB's MH hx and acknowledged a hx of anxiety/depression.  MOB reported MOB symptoms were managed by medication Vistaril and Buspar until about 3 weeks ago.  MOB stated MOB discontinued the use of medication because she no longer liked how it made her feel. CSW suggested outpatient therapy and MOB was receptive.  MOB reported MOB received in-home counseling through the [facility] with MOB's oldest son and communicated overall feeling better after consecutive counseling session. CSW offered to refer MOB to [facility] Louretta Shorten) and MOB gladly accepted the referral.  MOB communicated MOB's previous PPD consisted of feelings of hopelessness, daily crying, and feeling of being a failure. MOB stated that MOB's PPD signs and symptoms lasted for about 6 months until MOB was able to establish counseling.   CSW provided education regarding Baby Blues vs PMADs and provided MOB with information about support groups held at [facility]  [place] encouraged MOB to evaluate her mental health throughout the postpartum period with the use of the New Mom Checklist developed by Postpartum [facility] and notify a medical professional if symptoms arise.  CSW also received MOB's permission have follow-up care in 4 weeks with OB as oppose to 6 weeks. MOB appeared thankful for CSW's assistance.   MOB reports feeling prepared to parent and having all necessary  items for infant.  CSW educated MOB about SIDS and MOB appeared knowledgeable.   CSW updated bedside nurse and called [facility] to complete an enrollment application for MOB.  [facility] will call MOB and will schedule an appointment within 2 weeks.   CSW Plan/Description:  Information/Referral to [employer] , [employer] , No Further Intervention Required/No Barriers to Discharge   Laurey Arrow, MSW, LCSW Clinical Social Work [phone]  Dimple Nanas, LCSW 05/20/2017, 2:06 PM

## 2017-05-21 NOTE — Progress Notes (Signed)
Subjective: Postpartum Day 2: Cesarean Delivery Patient reports incisional pain, tolerating PO, + flatus and no problems voiding.    Objective: Vital signs in last 24 hours: Temp:  [97.6 F (36.4 C)] 97.6 F (36.4 C) (08/20 1736) Pulse Rate:  [81] 81 (08/20 1736) Resp:  [16] 16 (08/20 1736) BP: (120)/(67) 120/67 (08/20 1736)  Physical Exam:  General: alert, cooperative and no distress Lochia: appropriate Uterine Fundus: firm Incision: healing well, no significant drainage DVT Evaluation: No evidence of DVT seen on physical exam.   Recent Labs  05/19/17 0525 05/20/17 0511  HGB 11.3* 8.4*  HCT 33.4* 25.0*    Assessment/Plan: Status post Cesarean section. Doing well postoperatively.  Continue current care Discharge home tomorrow per pt request Discussed breastfeeding. Doesnot want to feed by breast but is willing to pump and bottle feed.  Wynelle Bourgeois 05/21/2017, 10:08 AM

## 2017-05-22 ENCOUNTER — Encounter: Payer: BLUE CROSS/BLUE SHIELD | Admitting: Obstetrics & Gynecology

## 2017-05-22 MED ORDER — IBUPROFEN 600 MG PO TABS
600.0000 mg | ORAL_TABLET | Freq: Four times a day (QID) | ORAL | 0 refills | Status: DC
Start: 2017-05-22 — End: 2020-05-23

## 2017-05-22 MED ORDER — TRAMADOL HCL 50 MG PO TABS
50.0000 mg | ORAL_TABLET | Freq: Four times a day (QID) | ORAL | 0 refills | Status: DC | PRN
Start: 2017-05-22 — End: 2020-05-23

## 2017-05-22 NOTE — Discharge Instructions (Signed)

## 2017-05-22 NOTE — Discharge Summary (Signed)
OB Discharge Summary     Patient Name: Beth Giles DOB: 05/09/1986 MRN: [id number]  Date of admission: 05/19/2017 Delivering MD: Catalina Antigua   Date of discharge: 05/22/2017  Admitting diagnosis: 37wks,water broke Intrauterine pregnancy: [redacted]w[redacted]d     Secondary diagnosis:  Active Problems:   S/P C-section  Additional problems: PROM, breech presentation     Discharge diagnosis: Term Pregnancy Delivered                                                                                                Post partum procedures:none  Augmentation: none  Complications: None  Hospital course:  Sceduled C/S   31 y.o. yo [id number] at [redacted]w[redacted]d was admitted to the hospital 05/19/2017 for PROM, and taken for cesarean section d/t fetal malpresentation (breech). Membrane Rupture Time/Date: 2:55 AM ,05/19/2017   Patient delivered a Viable infant.05/19/2017  Details of operation can be found in separate operative note.  Pateint had an uncomplicated postpartum course.  She is ambulating, tolerating a regular diet, passing flatus, and urinating well. Patient is discharged home in stable condition on  05/22/17         Physical exam  Vitals:   05/20/17 0522 05/20/17 1736 05/21/17 1806 05/22/17 0700  BP: (!) 102/46 120/67 132/88 132/72  Pulse: 74 81 97 76  Resp:  16 18 18   Temp: 98.2 F (36.8 C) 97.6 F (36.4 C) 98.1 F (36.7 C) 98.1 F (36.7 C)  TempSrc: Oral Oral Oral Oral  SpO2: 100%   98%  Weight:      Height:       General: alert, cooperative and no distress Lochia: appropriate Uterine Fundus: firm Incision: Healing well with no significant drainage, No significant erythema, Dressing is clean, dry, and intact DVT Evaluation: No evidence of DVT seen on physical exam. No significant calf/ankle edema. Labs: Lab Results  Component Value Date   WBC 14.7 (H) 05/20/2017   HGB 8.4 (L) 05/20/2017   HCT 25.0 (L) 05/20/2017   MCV 103.3 (H) 05/20/2017   PLT 301 05/20/2017   CMP Latest  Ref Rng & Units 04/01/2012  Glucose 70 - 99 mg/dL 147(W)  BUN 6 - 23 mg/dL 15  Creatinine 2.95 - 6.21 mg/dL 3.08  Sodium 657 - 846 mEq/L 135  Potassium 3.5 - 5.1 mEq/L 4.0  Chloride 96 - 112 mEq/L 103  CO2 19 - 32 mEq/L 25  Calcium 8.4 - 10.5 mg/dL 9.7  Total Protein 6.0 - 8.3 g/dL 7.4  Total Bilirubin 0.3 - 1.2 mg/dL 9.6(E)  Alkaline Phos 39 - 117 U/L 79  AST 0 - 37 U/L 14  ALT 0 - 35 U/L 8    Discharge instruction: per After Visit Summary and "Baby and Me Booklet".  After visit meds:  Allergies as of 05/22/2017      Reactions   Penicillins Cross Reactors Anaphylaxis, Rash   Unknown to pt Has patient had a PCN reaction causing immediate rash, facial/tongue/throat swelling, SOB or lightheadedness with hypotension: Yes Has patient had a PCN reaction causing severe rash involving mucus membranes or skin necrosis:No Has patient had  a PCN reaction that required hospitalization No Has patient had a PCN reaction occurring within the last 10 years: No If all of the above answers are "NO", then may proceed with Cephalosporin use.   Hydrocodone Swelling, Rash   Tongue swell, rash all over      Medication List    TAKE these medications   acetaminophen 325 MG tablet Commonly known as:  TYLENOL Take 325 mg by mouth every 6 (six) hours as needed for mild pain. For pain   calcium carbonate 500 MG chewable tablet Commonly known as:  TUMS - dosed in mg elemental calcium Chew 2 tablets by mouth 3 (three) times daily as needed for indigestion or heartburn.   hydrocortisone cream 1 % Apply 1 application topically 2 (two) times daily as needed for itching.   ibuprofen 600 MG tablet Commonly known as:  ADVIL,MOTRIN Take 1 tablet (600 mg total) by mouth every 6 (six) hours.   MAKENA 250 mg/mL Oil injection Generic drug:  hydroxyprogesterone caproate Inject 250 mg into the muscle once a week.   nicotine 7 mg/24hr patch Commonly known as:  NICODERM CQ - dosed in mg/24 hr Place 7 mg onto  the skin daily.   prenatal multivitamin Tabs tablet Take 1 tablet by mouth daily at 12 noon.   traMADol 50 MG tablet Commonly known as:  ULTRAM Take 1 tablet (50 mg total) by mouth every 6 (six) hours as needed (for pain).            Discharge Care Instructions        Start     Ordered   05/22/17 0000  ibuprofen (ADVIL,MOTRIN) 600 MG tablet  Every 6 hours     05/22/17 0956   05/22/17 0000  traMADol (ULTRAM) 50 MG tablet  Every 6 hours PRN     05/22/17 0956      Diet: routine diet  Activity: Advance as tolerated. Pelvic rest for 6 weeks.   Outpatient follow up: 4-6 weeks Follow up Appt:Future Appointments Date Time Provider Department Center  06/18/2017 1:20 PM Degele, Kandra Nicolas, MD [facility]   Follow up Visit:No Follow-up on file.  Postpartum contraception: Nexplanon  Newborn Data: Live born female  Birth Weight: 5 lb 8.7 oz (2515 g) APGAR: 9, 9  Baby Feeding: Breast Disposition:home with mother   05/22/2017 Frederik Pear, MD

## 2017-05-24 ENCOUNTER — Telehealth: Payer: Self-pay | Admitting: *Deleted

## 2017-05-24 DIAGNOSIS — Z00110 Health examination for newborn under 8 days old: Secondary | ICD-10-CM | POA: Diagnosis not present

## 2017-05-24 NOTE — Telephone Encounter (Signed)
Jae Dire from [facility] called requesting call back to let her know if patient needs another [place] shipment. Per chart, patient has delivered. Called pharmacy to let them know.

## 2017-05-28 DIAGNOSIS — Z00111 Health examination for newborn 8 to 28 days old: Secondary | ICD-10-CM | POA: Diagnosis not present

## 2017-06-18 ENCOUNTER — Encounter: Payer: Self-pay | Admitting: Family Medicine

## 2017-06-18 ENCOUNTER — Ambulatory Visit (INDEPENDENT_AMBULATORY_CARE_PROVIDER_SITE_OTHER): Payer: BLUE CROSS/BLUE SHIELD | Admitting: Family Medicine

## 2017-06-18 DIAGNOSIS — Z3202 Encounter for pregnancy test, result negative: Secondary | ICD-10-CM | POA: Diagnosis not present

## 2017-06-18 DIAGNOSIS — R87810 Cervical high risk human papillomavirus (HPV) DNA test positive: Secondary | ICD-10-CM

## 2017-06-18 DIAGNOSIS — Z98891 History of uterine scar from previous surgery: Secondary | ICD-10-CM

## 2017-06-18 DIAGNOSIS — R87610 Atypical squamous cells of undetermined significance on cytologic smear of cervix (ASC-US): Secondary | ICD-10-CM

## 2017-06-18 DIAGNOSIS — Z30013 Encounter for initial prescription of injectable contraceptive: Secondary | ICD-10-CM | POA: Diagnosis not present

## 2017-06-18 LAB — POCT PREGNANCY, URINE: Preg Test, Ur: NEGATIVE

## 2017-06-18 MED ORDER — MEDROXYPROGESTERONE ACETATE 150 MG/ML IM SUSP
150.0000 mg | Freq: Once | INTRAMUSCULAR | Status: AC
  Administered 2017-06-18: 150 mg via INTRAMUSCULAR

## 2017-06-18 NOTE — Progress Notes (Signed)
Subjective:     Beth Giles is a 31 y.o. female [phone] who presents for a postpartum visit. She is 4 weeks postpartum following a low cervical transverse Cesarean section. I have fully reviewed the prenatal and intrapartum course. The delivery was at 37 gestational weeks. Outcome: primary cesarean section, low transverse incision for breech presentation. Anesthesia: epidural.   Postpartum course has been uncomplicated. Baby's course has been uncomplicated. Baby is feeding by bottle - Similac Advance. Bleeding staining only. Bowel function is normal. Bladder function is normal. Patient is not sexually active. Contraception method is none. Unsure about contraception method. Reprots she is thinking about BTL, but would like wait. Requesting OCPs in the meantime. Postpartum depression screening: negative.  The following portions of the patient's history were reviewed and updated as appropriate: allergies, current medications, past family history, past medical history, past social history, past surgical history and problem list.  Review of Systems Pertinent items noted in HPI and remainder of comprehensive ROS otherwise negative.   Objective:    BP (!) 144/91   Pulse 71   Ht  (1.575 m)   Wt 130 lb 9.6 oz (59.2 kg)   Breastfeeding? No   BMI 23.89 kg/m   Repeat manual BP: 128/84  General:  alert, cooperative and no distress   HEENT:  [place]/AT. Normal conjunctivae. No scleral icterus. Normal oral mucosa, MMM  Lungs: clear to auscultation bilaterally, normal WOB  Heart:  Normal rate, regular rythm  Abdomen: Soft, NT, ND, +BS   Skin:  warm, dry; C/S incision healing well w/o erythema or drainage  MSK: No edema. Normal ROM.   Psych:  Normal mood and affect        Assessment:      1. Normal postpartum exam.  2. Hx of abnormal pap smear/CIN I: per patient, UPT on pap, last done 11/2016 at [facility]. After visit, [facility] records reviewed, which show: - 06/2013: LSIL +HPV on pap --> colpo w/ bx  11/2013: CIN I in 2015 - 11/2015: NILM, +HPV on pap (no colpo done) - 11/29/2016: ASCUS, +HPV  Plan:    1. Contraception: thinking about BTL. Counseled about contraceptive options, including LARCs in the meantime. She will like Depo (given today).   2. Hx of CIN I (2015), ACUS pap w/ +HPV 11/2016: needs colposcopy per ASCCP guidelines. Called patient and discussed. Will schedule colposcopy.   Raynelle Fanning P. Jadarian Mckay, MD OB Fellow

## 2017-06-21 DIAGNOSIS — R87810 Cervical high risk human papillomavirus (HPV) DNA test positive: Secondary | ICD-10-CM

## 2017-06-21 DIAGNOSIS — R87610 Atypical squamous cells of undetermined significance on cytologic smear of cervix (ASC-US): Secondary | ICD-10-CM | POA: Insufficient documentation

## 2017-07-01 ENCOUNTER — Ambulatory Visit: Payer: BLUE CROSS/BLUE SHIELD | Admitting: Advanced Practice Midwife

## 2017-07-08 ENCOUNTER — Encounter: Payer: Self-pay | Admitting: Family Medicine

## 2017-07-12 ENCOUNTER — Ambulatory Visit: Payer: BLUE CROSS/BLUE SHIELD | Admitting: Family Medicine

## 2017-08-05 ENCOUNTER — Ambulatory Visit: Payer: BLUE CROSS/BLUE SHIELD | Admitting: Obstetrics and Gynecology

## 2017-08-05 ENCOUNTER — Encounter: Payer: Self-pay | Admitting: Obstetrics and Gynecology

## 2017-08-05 NOTE — Progress Notes (Signed)
Patient did not show for her colposcopy for 08/05/17 for ASCUS, +hrHPV. She will be contacted to reschedule.  Baldemar LenisK. Meryl Waunita Sandstrom, M.D. Attending Obstetrician & Gynecologist, [facility], [facility]

## 2017-08-17 DIAGNOSIS — F331 Major depressive disorder, recurrent, moderate: Secondary | ICD-10-CM | POA: Diagnosis not present

## 2017-08-31 DIAGNOSIS — F331 Major depressive disorder, recurrent, moderate: Secondary | ICD-10-CM | POA: Diagnosis not present

## 2017-09-03 ENCOUNTER — Ambulatory Visit: Payer: BLUE CROSS/BLUE SHIELD

## 2017-10-15 IMAGING — US US MFM OB TRANSVAGINAL
3 series · 13 of 28 positions shown · non-contrast
Comparison: none

OB/Gyn Clinic
LOCKLEAR NP

1  MAJOLA IGNATIA           [id number]      [id number]     [id number]
2  MAJOLA IGNATIA           [id number]      [id number]     [id number]
Indications
18 weeks gestation of pregnancy
Poor obstetric history (prior pre-term labor)
(28 weeks)
Encounter for antenatal screening for
chromosomal anomalies
Cervical shortening, second trimester s/p
cerclage
OB History
Blood Type:            Height:         Weight (lb):  128      BMI:
Gravidity:    3         Term:   0        Prem:   1        SAB:   0
TOP:          1       Ectopic:  0        Living: 1
Fetal Evaluation
Num Of Fetuses:     1
Fetal Heart         143
Rate(bpm):
Cardiac Activity:   Observed
Presentation:       Breech
Placenta:           Posterior, above cervical os
P. Cord Insertion:  Visualized, central
Amniotic Fluid
AFI FV:      Subjectively within normal limits
Largest Pocket(cm)
3.6
Biometry
BPD:      35.5  mm     G. Age:  16w 6d          3  %    CI:        63.36   %   70 - 86
FL/HC:      17.1   %   15.8 - 18
HC:      143.7  mm     G. Age:  17w 4d         10  %    HC/AC:      1.15       1.07 -
AC:      125.3  mm     G. Age:  18w 1d         38  %    FL/BPD:     69.3   %
FL:       24.6  mm     G. Age:  17w 3d         13  %    FL/AC:      19.6   %   20 - 24
HUM:      22.8  mm     G. Age:  17w 0d         11  %
Est. FW:     208  gm      0 lb 7 oz     33  %
Gestational Age
LMP:           18w 3d       Date:   09/01/16                 EDD:   06/08/17
U/S Today:     17w 4d                                        EDD:   06/14/17
Best:          18w 3d    Det. By:   LMP  (09/01/16)          EDD:   06/08/17
Anatomy
Cranium:               Appears normal         LVOT:                   Appears normal
Cavum:                 Appears normal         Aortic Arch:            Not well visualized
Ventricles:            Appears normal         Ductal Arch:            Not well visualized
Choroid Plexus:        Appears normal         Diaphragm:              Appears normal
Cerebellum:            Appears normal         Stomach:                Appears normal, left
sided
Posterior Fossa:       Appears normal         Abdomen:                Appears normal
Nuchal Fold:           Appears normal         Abdominal Wall:         Appears nml (cord
insert, abd wall)
Face:                  Appears normal         Cord Vessels:           Appears normal (3
(orbits and profile)                           vessel cord)
Lips:                  Appears normal         Kidneys:                Appear normal
Palate:                Appears normal         Bladder:                Appears normal
Thoracic:              Appears normal         Spine:                  Appears normal
Heart:                 Appears normal         Upper Extremities:      Appears normal
(4CH, axis, and
situs)
RVOT:                  Appears normal         Lower Extremities:      Appears normal
Other:  Fetus appears to be a female. 5th digit visualized.
Cervix Uterus Adnexa
Cervix
Length:            2.2  cm.
Measured transvaginally. Cerclage visualized.
Uterus
No abnormality visualized.
Left Ovary
No adnexal mass visualized.
Right Ovary
Cul De Sac:   No free fluid seen.
Adnexa:       No abnormality visualized.
Impression
INDICATION: 30 yr old [id number] at 74w4d with history of
preterm delivery and short cervix s/p cerclage for fetal
anatomic survey and cervical length.

[Series 1: us mfm ob transvaginal · 62 acquisitions, 8 frames shown (1 of 3)]
[im 4/62]
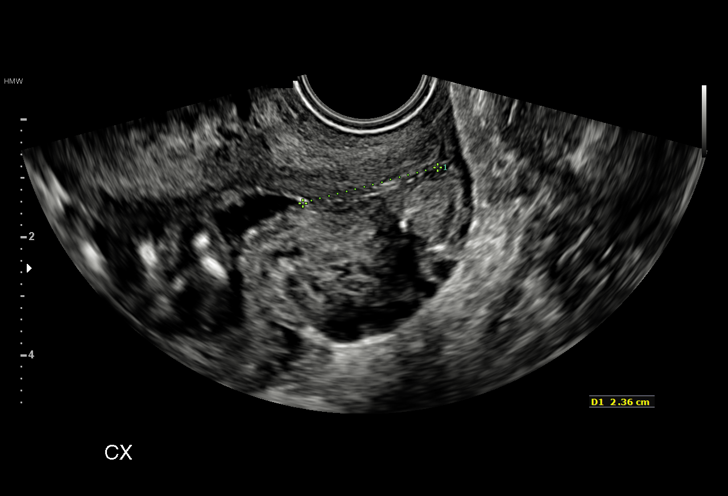
[im 12/62]
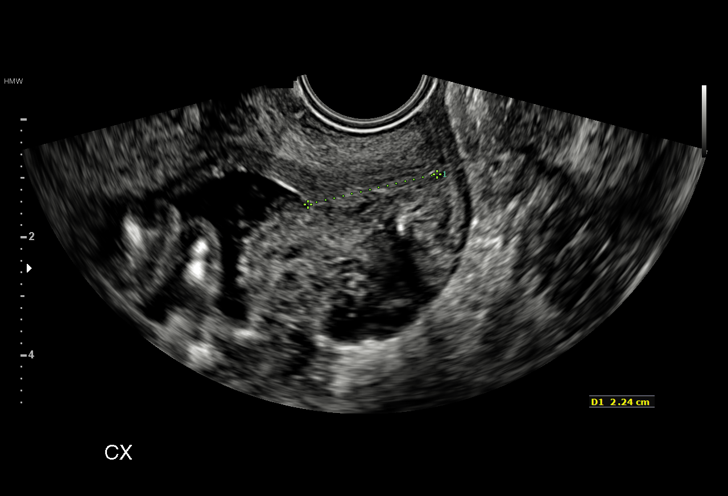
[im 20/62]
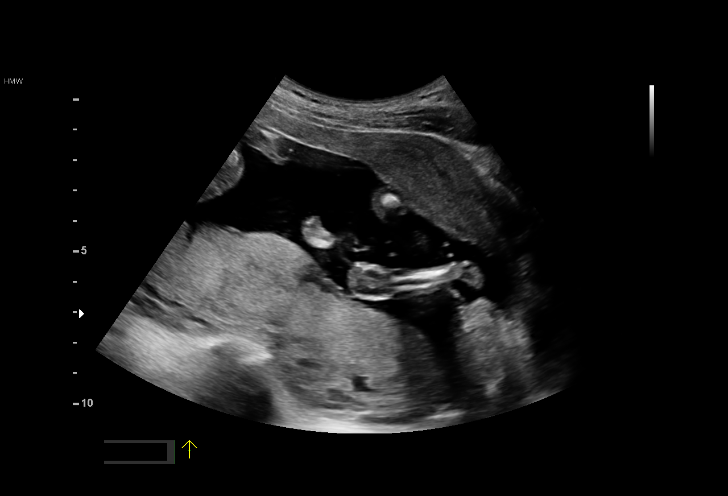
[im 27/62]
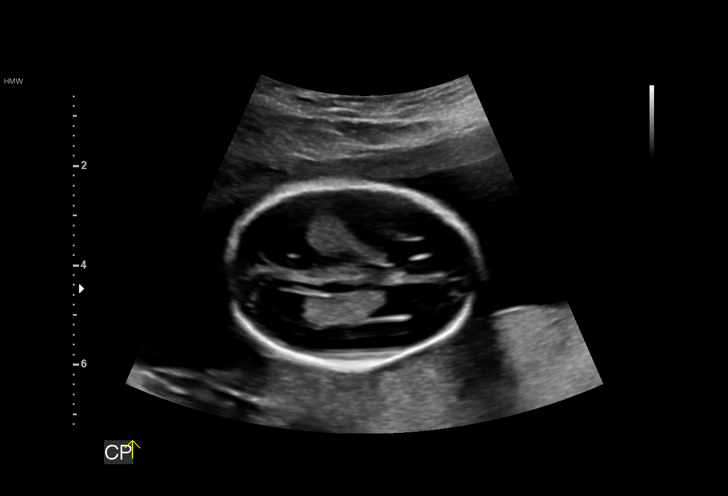
[im 35/62]
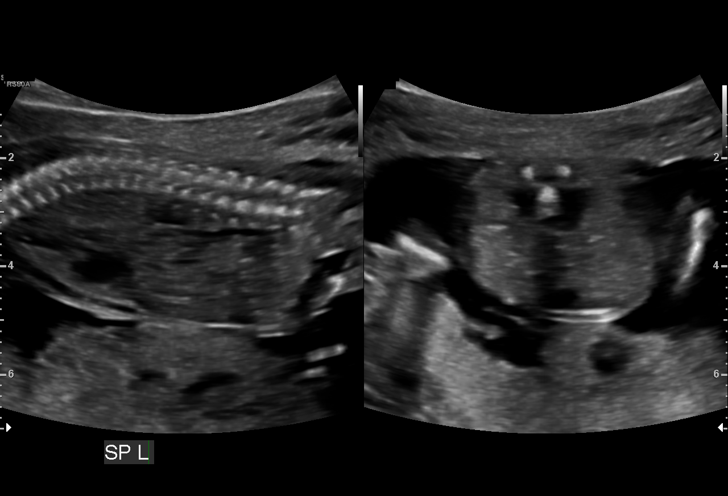
[im 42/62]
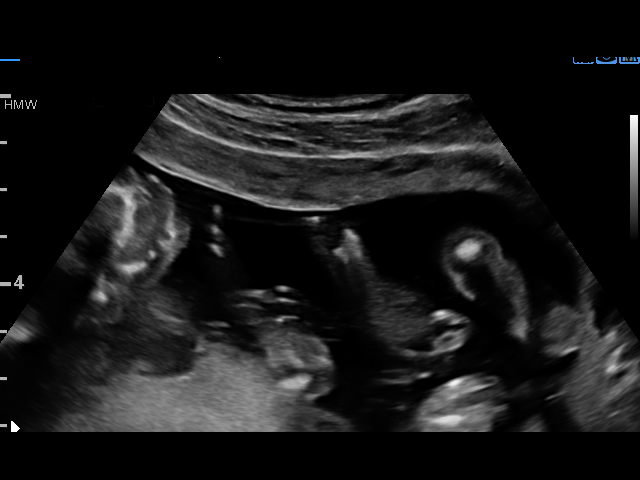
[im 54/62]
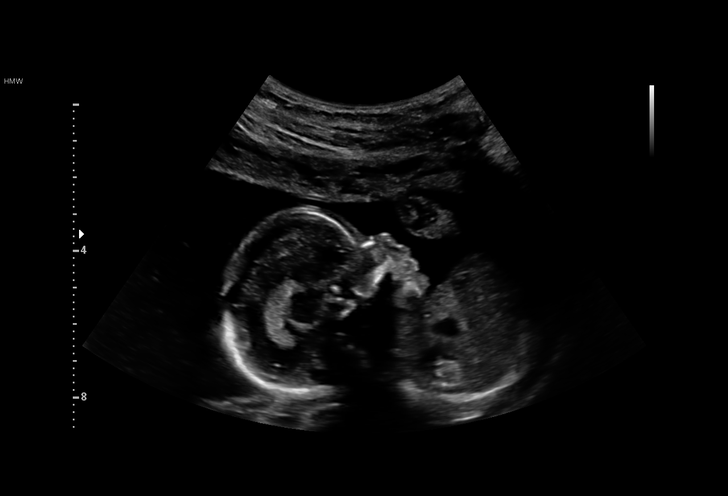
[im 62/62]
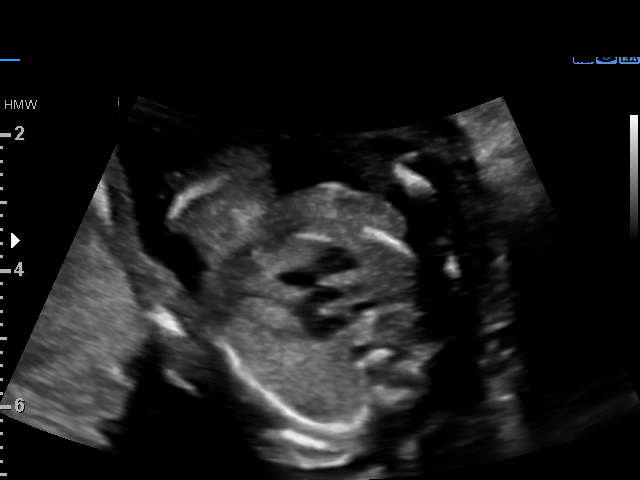

[Series 3: us mfm ob transvaginal · 1 of 12 slices shown (2 of 3)]
[im 6/12]
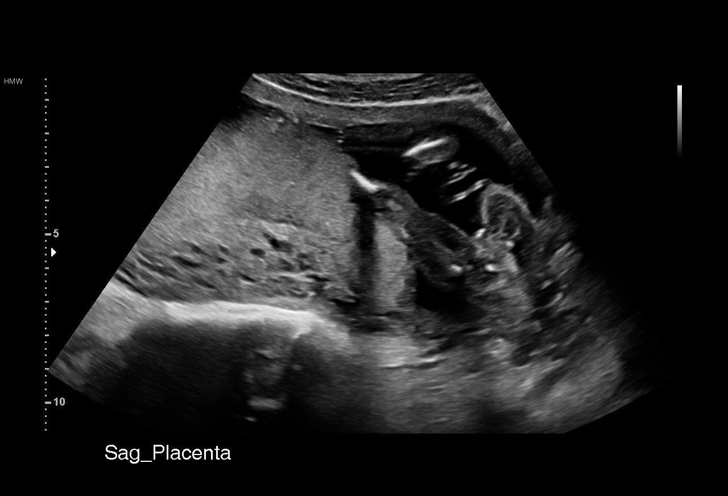

[Series 4: us mfm ob transvaginal · 4 of 30 slices shown (3 of 3)]
[im 1/30]
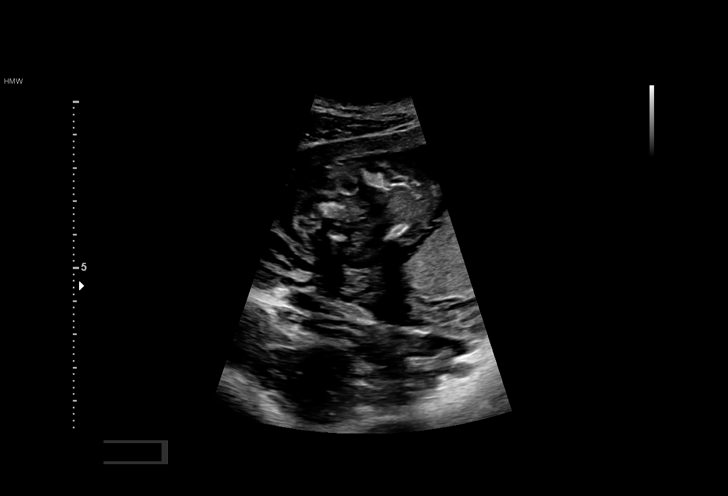
[im 9/30]
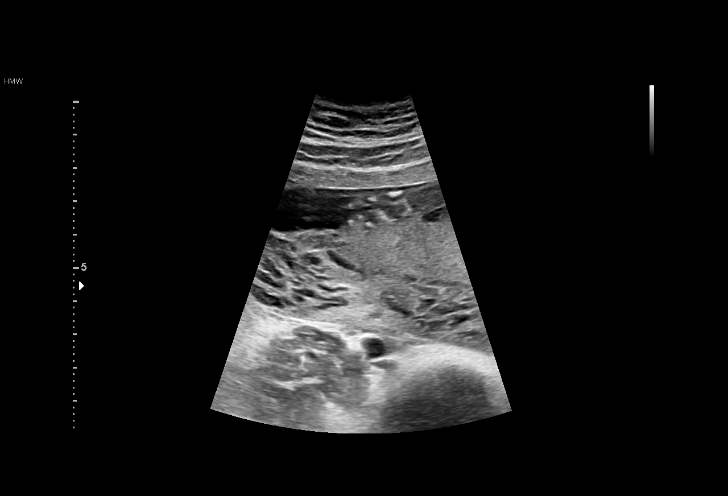
[im 17/30]
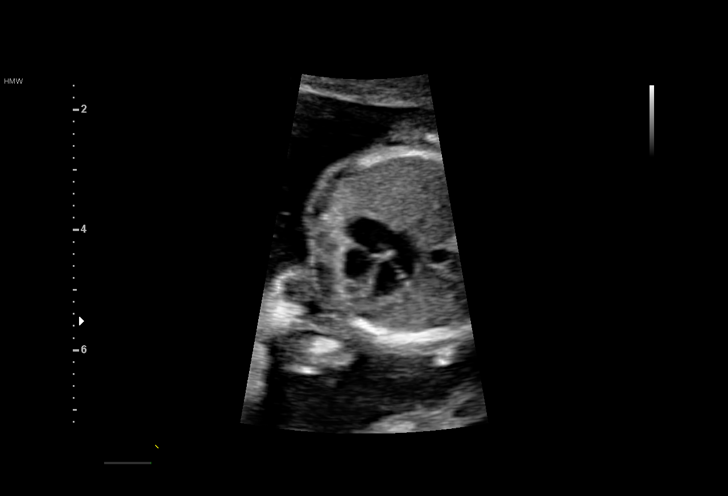
[im 25/30]
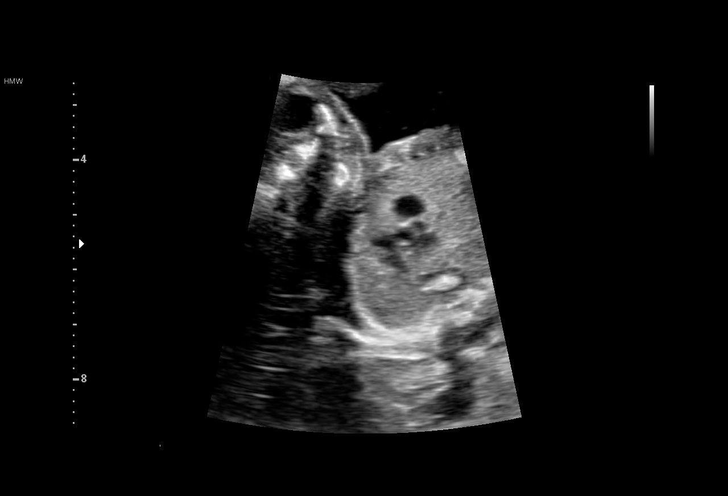

[13 of 28 positions shown; findings below may reference images not displayed]

FINDINGS: 1. Single intrauterine pregnancy.
2. Fetal biometry is consistent with dating.
3. Posterior placenta without evidence of previa.
4. Normal amniotic fluid volume.
5. Shortened transvaginal cervical length to 2.2cm; the
cerclage is visualized. No funneling is seen.
6. The views of the ductal and aortic arches are limited.
7. The remainder of the limited anatomy survey is normal.
Recommendations

1. Appropriate fetal growth.
2. Limited anatomy survey:
- reevaluate fetal anatomy on follow up ultrasound
3. History of preterm delivery; now with short cervix:
- s/p cerclage
- on Frederic
- recommend continue cervical length surveillance until 28
weeks
- if further shortening occurs recommend add vaginal
progesterone
- recommend preterm labor precautions
4. Normal first trimester screen and MSAFP

## 2017-11-13 IMAGING — US US MFM OB FOLLOW-UP
2 series · 13 of 28 positions shown · non-contrast
Comparison: none

[Series 1: us mfm ob follow-up · 12 of 56 slices shown (1 of 2)]
[im 3/56]
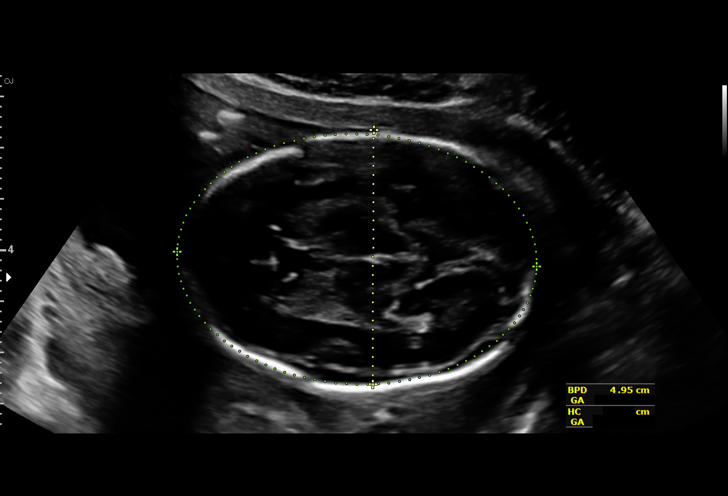
[im 7/56]
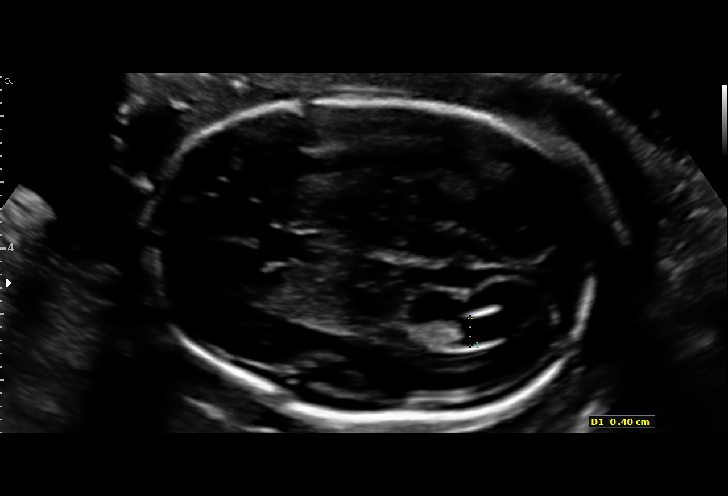
[im 12/56]
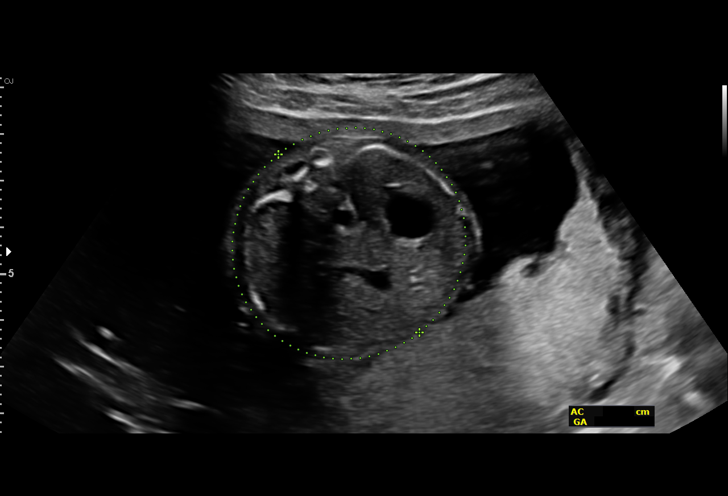
[im 16/56]
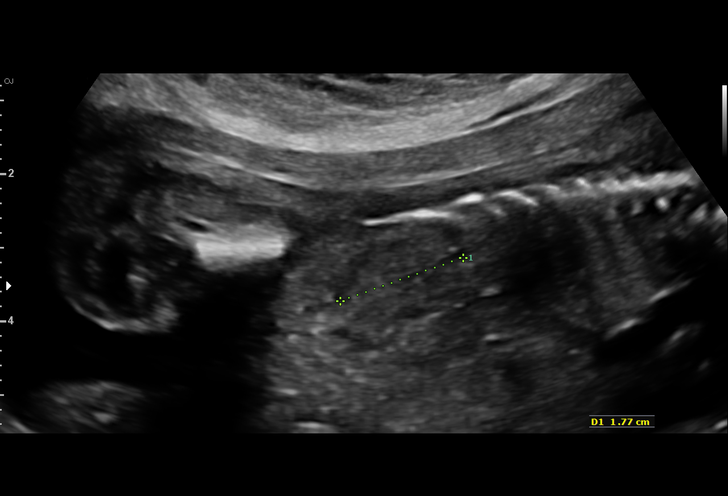
[im 20/56]
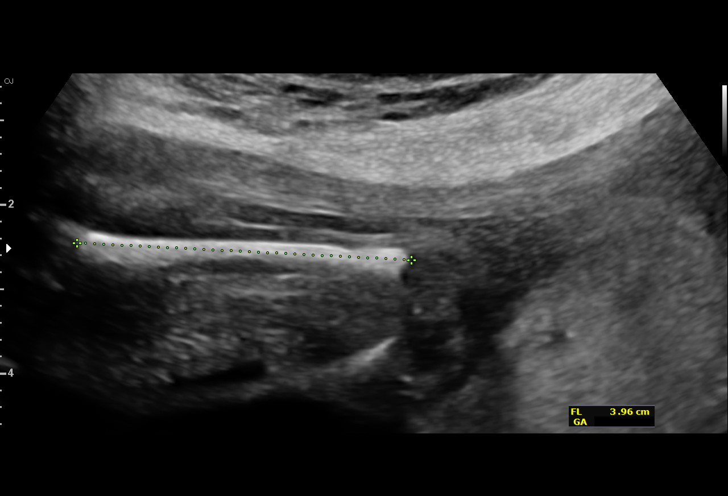
[im 25/56]
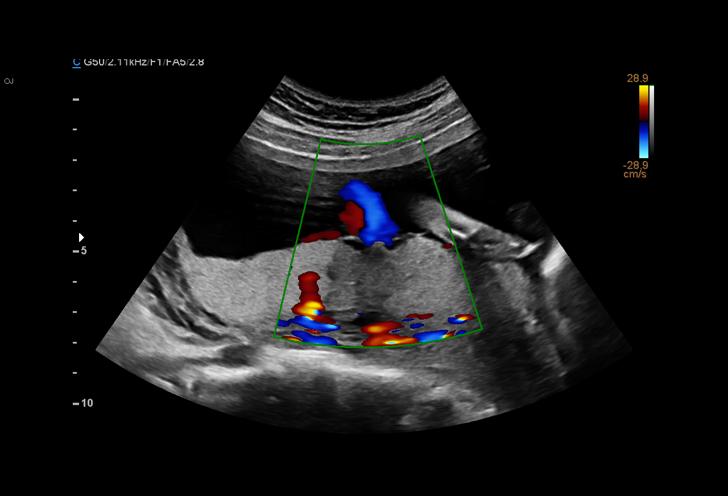
[im 31/56]
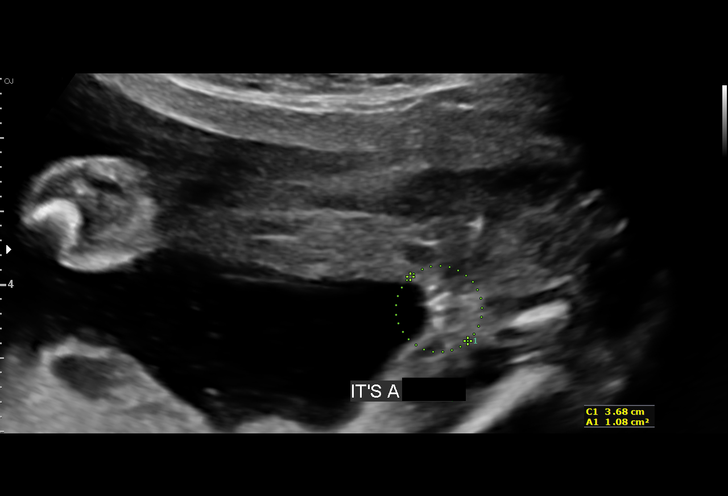
[im 36/56]
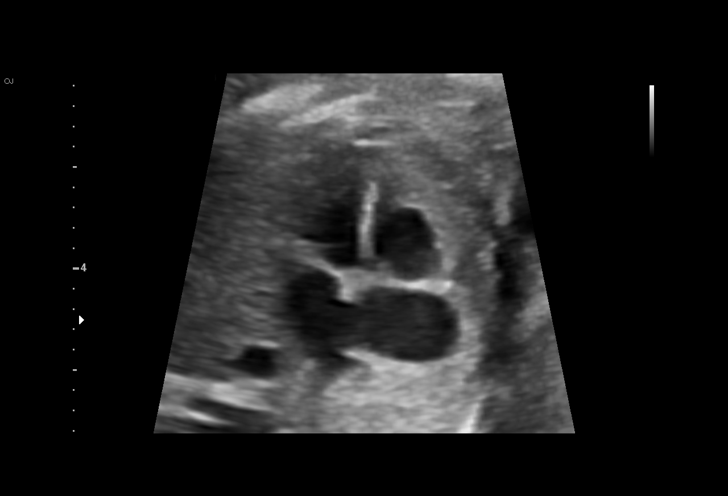
[im 40/56]
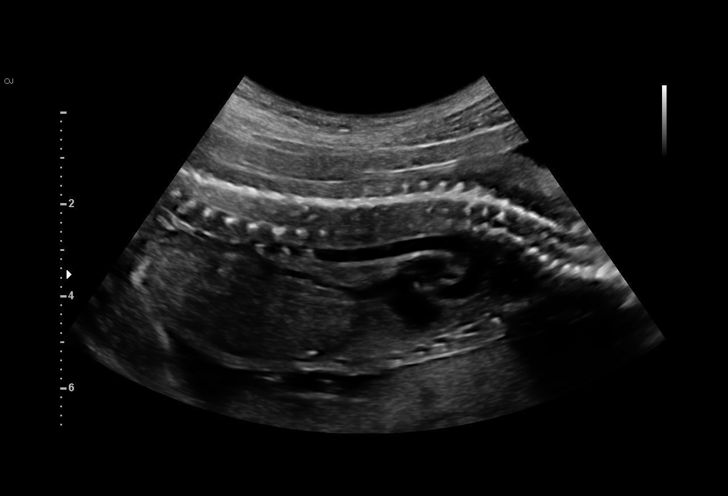
[im 45/56]
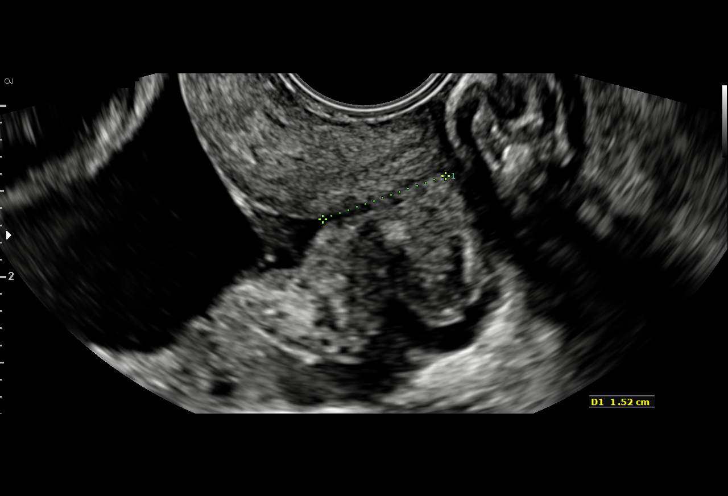
[im 49/56]
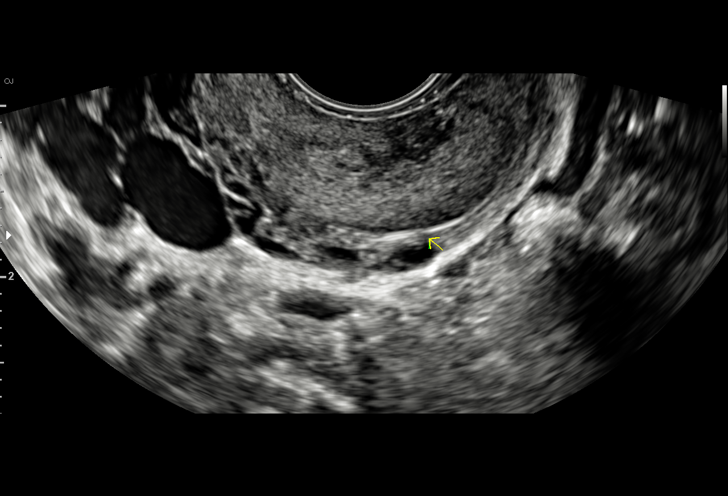
[im 53/56]
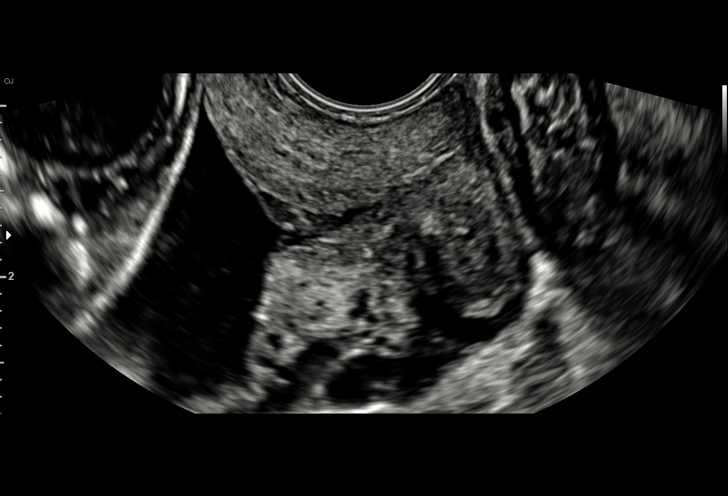

[Series 3: us mfm ob follow-up · 1 of 4 slices shown (2 of 2)]
[im 1/4]
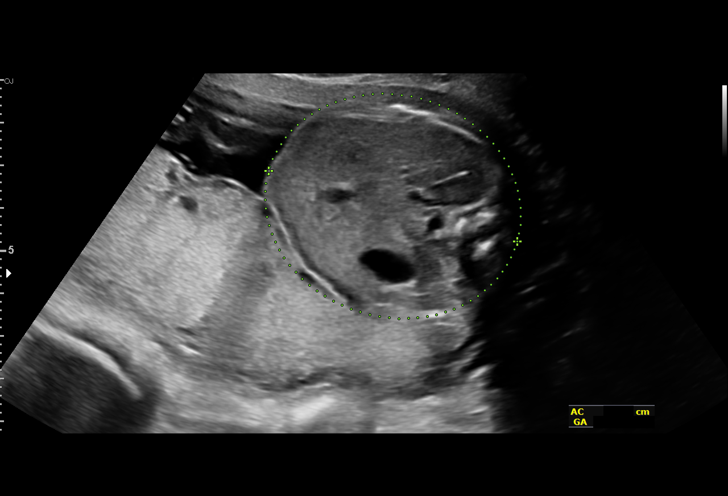

[13 of 28 positions shown; findings below may reference images not displayed]

OB/Gyn Clinic
GALSEN NP

1  YAILIN SIL            [id number]      [id number]     [id number]
2  PANGIT LALU           [id number]      [id number]     [id number]
Indications

22 weeks gestation of pregnancy
Poor obstetric history (prior pre-term labor)
(28 weeks)-[REDACTED]g progesterone
Cervical shortening, second trimester s/p
cerclage
OB History

Blood Type:            Height:         Weight (lb):  128      BMI:
Gravidity:    3         Term:   0        Prem:   1        SAB:   0
TOP:          1       Ectopic:  0        Living: 1
Fetal Evaluation

Num Of Fetuses:     1
Fetal Heart         149
Rate(bpm):
Cardiac Activity:   Observed
Presentation:       Cephalic
Placenta:           Posterior, above cervical os
P. Cord Insertion:  Visualized
Amniotic Fluid
AFI FV:      Subjectively within normal limits

AFI Sum(cm)     %Tile       Largest Pocket(cm)
3.51            < 3

RUQ(cm)
3.51
Biometry

BPD:      48.1  mm     G. Age:  20w 4d          1  %    CI:        66.43   %   70 - 86
FL/HC:      20.8   %   19.2 -
HC:      189.2  mm     G. Age:  21w 2d          3  %    HC/AC:      1.11       1.05 -
AC:      170.6  mm     G. Age:  22w 0d         25  %    FL/BPD:     81.7   %   71 - 87
FL:       39.3  mm     G. Age:  22w 4d         42  %    FL/AC:      23.0   %   20 - 24
HUM:      35.6  mm     G. Age:  22w 3d         39  %

Est. FW:     478  gm      1 lb 1 oz     39  %
Gestational Age

LMP:           22w 4d       Date:   09/01/16                 EDD:   06/08/17
U/S Today:     21w 4d                                        EDD:   06/15/17
Best:          22w 4d    Det. By:   LMP  (09/01/16)          EDD:   06/08/17
Anatomy

Cranium:               Appears normal         Aortic Arch:            Appears normal
Cavum:                 Appears normal         Ductal Arch:            Not well visualized
Ventricles:            Appears normal         Diaphragm:              Appears normal
Choroid Plexus:        Previously seen        Stomach:                Appears normal, left
sided
Cerebellum:            Previously seen        Abdomen:                Appears normal
Posterior Fossa:       Previously seen        Abdominal Wall:         Previously seen
Nuchal Fold:           Previously seen        Cord Vessels:           Appears normal (3
vessel cord)
Face:                  Orbits and profile     Kidneys:                Appear normal
previously seen
Lips:                  Previously seen        Bladder:                Appears normal
Thoracic:              Appears normal         Spine:                  Previously seen
Heart:                 Appears normal         Upper Extremities:      Previously seen
(4CH, axis, and
situs)
RVOT:                  Previously seen        Lower Extremities:      Previously seen
LVOT:                  Appears normal

Other:  Fetus appears to be a female. 5th digit visualized.
Cervix Uterus Adnexa

Cervix
Length:            1.4  cm.
Measured transvaginally.

Uterus
No abnormality visualized.

Left Ovary
Not visualized. No adnexal mass visualized.
Right Ovary
Not visualized. No adnexal mass visualized.
Impression

Singleton intrauterine pregnancy at 22 weeks 4 days
gestation with fetal cardiac activity
Cephalic presentation
Posterior placenta without evidence of previa
Normal appearing fetal growth and amniotic fluid volume
Still unable to visualize the fetal ductal arch
Cervical length with funneling to 1.4 cm
Currently on Lilibeth and vaginal progesterone
She is scheduled to get antenatal steroids at viability per the
patient
Recommendations

Recommend follow-up ultrasound examination as scheduled
in 
 1.5 weeks (
24 weeks gestation)

## 2018-02-26 ENCOUNTER — Ambulatory Visit (INDEPENDENT_AMBULATORY_CARE_PROVIDER_SITE_OTHER): Payer: BLUE CROSS/BLUE SHIELD | Admitting: General Practice

## 2018-02-26 ENCOUNTER — Encounter: Payer: Self-pay | Admitting: General Practice

## 2018-02-26 VITALS — BP 149/91 | HR 93 | Temp 99.2°F

## 2018-02-26 DIAGNOSIS — N390 Urinary tract infection, site not specified: Secondary | ICD-10-CM | POA: Diagnosis not present

## 2018-02-26 DIAGNOSIS — R319 Hematuria, unspecified: Secondary | ICD-10-CM | POA: Diagnosis not present

## 2018-02-26 LAB — POCT URINALYSIS DIP (DEVICE)
GLUCOSE, UA: NEGATIVE mg/dL
KETONES UR: NEGATIVE mg/dL
Nitrite: POSITIVE — AB
Protein, ur: >=300 mg/dL — AB
SPECIFIC GRAVITY, URINE: 1.02 (ref 1.005–1.030)
UROBILINOGEN UA: 0.2 mg/dL (ref 0.0–1.0)
pH: 6 (ref 5.0–8.0)

## 2018-02-26 MED ORDER — SULFAMETHOXAZOLE-TRIMETHOPRIM 800-160 MG PO TABS: 1.0000 | ORAL_TABLET | Freq: Two times a day (BID) | ORAL | 0 refills | Status: AC

## 2018-02-26 MED ORDER — PHENAZOPYRIDINE HCL 200 MG PO TABS: 200.0000 mg | ORAL_TABLET | Freq: Three times a day (TID) | ORAL | 0 refills | Status: AC | PRN

## 2018-02-26 NOTE — Progress Notes (Signed)
I have reviewed the chart and agree with nursing staff's documentation of this patient's encounter.  Vonzella Nipple, PA-C 02/26/2018 11:56 AM

## 2018-02-26 NOTE — Progress Notes (Signed)
Patient presents to office today reporting dysuria, incomplete emptying of bladder, & lower/mid back pain for 2 days. Reports not being able to sleep at all last night; woke up feeling hot/cold and having back pain. UA reveals UTI. Urine culture ordered. Bactrim DS & pyridium ordered for patient per protocol. Discussed with patient she should go to ER if fever rises above 100.4 or if back pain becomes severe. Discussed we will contact her if we need to change her antibiotic after we get the culture results back. Patient verbalized understanding to all & had no questions.

## 2018-02-28 LAB — URINE CULTURE

## 2019-02-02 DIAGNOSIS — I10 Essential (primary) hypertension: Secondary | ICD-10-CM | POA: Diagnosis not present

## 2019-02-02 DIAGNOSIS — G4709 Other insomnia: Secondary | ICD-10-CM | POA: Diagnosis not present

## 2019-03-02 DIAGNOSIS — R319 Hematuria, unspecified: Secondary | ICD-10-CM | POA: Diagnosis not present

## 2019-03-02 DIAGNOSIS — R51 Headache: Secondary | ICD-10-CM | POA: Diagnosis not present

## 2019-03-02 DIAGNOSIS — G4709 Other insomnia: Secondary | ICD-10-CM | POA: Diagnosis not present

## 2019-03-02 DIAGNOSIS — R82998 Other abnormal findings in urine: Secondary | ICD-10-CM | POA: Diagnosis not present

## 2019-03-02 DIAGNOSIS — I10 Essential (primary) hypertension: Secondary | ICD-10-CM | POA: Diagnosis not present

## 2019-03-20 DIAGNOSIS — Z20828 Contact with and (suspected) exposure to other viral communicable diseases: Secondary | ICD-10-CM | POA: Diagnosis not present

## 2019-03-30 DIAGNOSIS — Z3042 Encounter for surveillance of injectable contraceptive: Secondary | ICD-10-CM | POA: Diagnosis not present

## 2019-03-30 DIAGNOSIS — G4709 Other insomnia: Secondary | ICD-10-CM | POA: Diagnosis not present

## 2019-03-30 DIAGNOSIS — Z309 Encounter for contraceptive management, unspecified: Secondary | ICD-10-CM | POA: Diagnosis not present

## 2019-03-30 DIAGNOSIS — R51 Headache: Secondary | ICD-10-CM | POA: Diagnosis not present

## 2019-04-01 DIAGNOSIS — Z3042 Encounter for surveillance of injectable contraceptive: Secondary | ICD-10-CM | POA: Diagnosis not present

## 2019-04-24 DIAGNOSIS — Z131 Encounter for screening for diabetes mellitus: Secondary | ICD-10-CM | POA: Diagnosis not present

## 2019-04-24 DIAGNOSIS — Z23 Encounter for immunization: Secondary | ICD-10-CM | POA: Diagnosis not present

## 2019-04-24 DIAGNOSIS — Z0000 Encounter for general adult medical examination without abnormal findings: Secondary | ICD-10-CM | POA: Diagnosis not present

## 2019-04-24 DIAGNOSIS — Z113 Encounter for screening for infections with a predominantly sexual mode of transmission: Secondary | ICD-10-CM | POA: Diagnosis not present

## 2019-04-24 DIAGNOSIS — I10 Essential (primary) hypertension: Secondary | ICD-10-CM | POA: Diagnosis not present

## 2019-04-24 DIAGNOSIS — R87610 Atypical squamous cells of undetermined significance on cytologic smear of cervix (ASC-US): Secondary | ICD-10-CM | POA: Diagnosis not present

## 2019-04-24 DIAGNOSIS — R87810 Cervical high risk human papillomavirus (HPV) DNA test positive: Secondary | ICD-10-CM | POA: Diagnosis not present

## 2019-04-24 DIAGNOSIS — Z124 Encounter for screening for malignant neoplasm of cervix: Secondary | ICD-10-CM | POA: Diagnosis not present

## 2019-04-24 DIAGNOSIS — Z13220 Encounter for screening for lipoid disorders: Secondary | ICD-10-CM | POA: Diagnosis not present

## 2019-05-19 DIAGNOSIS — I10 Essential (primary) hypertension: Secondary | ICD-10-CM | POA: Diagnosis not present

## 2019-05-19 DIAGNOSIS — G44229 Chronic tension-type headache, not intractable: Secondary | ICD-10-CM | POA: Diagnosis not present

## 2019-06-12 DIAGNOSIS — Z20828 Contact with and (suspected) exposure to other viral communicable diseases: Secondary | ICD-10-CM | POA: Diagnosis not present

## 2019-08-07 DIAGNOSIS — Z20828 Contact with and (suspected) exposure to other viral communicable diseases: Secondary | ICD-10-CM | POA: Diagnosis not present

## 2019-12-21 ENCOUNTER — Ambulatory Visit: Payer: Medicaid Other | Attending: Internal Medicine

## 2019-12-21 DIAGNOSIS — Z23 Encounter for immunization: Secondary | ICD-10-CM

## 2019-12-21 NOTE — Progress Notes (Signed)
   Covid-19 Vaccination Clinic  Name:  Beth Giles    MRN: [id number] DOB: Sep 08, 1986  12/21/2019  Beth Giles was observed post Covid-19 immunization for 30 minutes based on pre-vaccination screening without incident. She was provided with Vaccine Information Sheet and instruction to access the V-Safe system.   Beth Giles was instructed to call 911 with any severe reactions post vaccine: Marland Kitchen Difficulty breathing  . Swelling of face and throat  . A fast heartbeat  . A bad rash all over body  . Dizziness and weakness   Immunizations Administered    Name Date Dose VIS Date Route   Pfizer COVID-19 Vaccine 12/21/2019  9:11 AM 0.3 mL 09/11/2019 Intramuscular   Manufacturer: [employer], [employer]   Lot: [id number]   NDC: [address]-7

## 2020-01-13 ENCOUNTER — Ambulatory Visit: Payer: Medicaid Other | Attending: Internal Medicine

## 2020-01-13 DIAGNOSIS — Z23 Encounter for immunization: Secondary | ICD-10-CM

## 2020-01-13 NOTE — Progress Notes (Signed)
   Covid-19 Vaccination Clinic  Name:  Beth Giles    MRN: [id number] DOB: [birth year]  01/13/2020  Beth Giles was observed post Covid-19 immunization for 30 minutes based on pre-vaccination screening without incident. She was provided with Vaccine Information Sheet and instruction to access the V-Safe system.   Beth Giles was instructed to call 911 with any severe reactions post vaccine: Marland Kitchen Difficulty breathing  . Swelling of face and throat  . A fast heartbeat  . A bad rash all over body  . Dizziness and weakness   Immunizations Administered    Name Date Dose VIS Date Route   Pfizer COVID-19 Vaccine 01/13/2020 12:51 PM 0.3 mL 09/11/2019 Intramuscular   Manufacturer: [employer], [employer]   Lot: [id number]   NDC: [address]-7

## 2020-01-14 ENCOUNTER — Encounter: Payer: Self-pay | Admitting: *Deleted

## 2020-05-05 ENCOUNTER — Emergency Department (HOSPITAL_COMMUNITY)
Admission: EM | Admit: 2020-05-05 | Discharge: 2020-05-05 | Disposition: A | Discharge status: Home / Self Care | Payer: Managed Care, Other (non HMO) | Attending: Emergency Medicine | Admitting: Emergency Medicine

## 2020-05-05 ENCOUNTER — Other Ambulatory Visit: Payer: Self-pay

## 2020-05-05 ENCOUNTER — Encounter (HOSPITAL_COMMUNITY): Payer: Self-pay | Admitting: Emergency Medicine

## 2020-05-05 DIAGNOSIS — N764 Abscess of vulva: Secondary | ICD-10-CM | POA: Insufficient documentation

## 2020-05-05 DIAGNOSIS — Z5321 Procedure and treatment not carried out due to patient leaving prior to being seen by health care provider: Secondary | ICD-10-CM | POA: Insufficient documentation

## 2020-05-05 NOTE — ED Triage Notes (Signed)
Patient reports abscess to left vagina x2 days. States sent from urgent care for further evaluation. Reports on day 3 of flagyl after tele health dx with bacterial vaginosis.

## 2020-05-05 NOTE — ED Notes (Signed)
Per registration, patient turned in labels and reports she is leaving. 

## 2020-05-09 ENCOUNTER — Other Ambulatory Visit: Payer: Self-pay

## 2020-05-09 ENCOUNTER — Other Ambulatory Visit (HOSPITAL_COMMUNITY)
Admission: RE | Admit: 2020-05-09 | Discharge: 2020-05-09 | Disposition: A | Discharge status: Home / Self Care | Payer: Managed Care, Other (non HMO) | Source: Ambulatory Visit | Attending: Obstetrics and Gynecology | Admitting: Obstetrics and Gynecology

## 2020-05-09 ENCOUNTER — Ambulatory Visit (INDEPENDENT_AMBULATORY_CARE_PROVIDER_SITE_OTHER): Payer: Managed Care, Other (non HMO) | Admitting: Obstetrics and Gynecology

## 2020-05-09 ENCOUNTER — Encounter: Payer: Self-pay | Admitting: Obstetrics and Gynecology

## 2020-05-09 VITALS — BP 137/87 | HR 98 | Ht 62.0 in | Wt 145.3 lb

## 2020-05-09 DIAGNOSIS — Z124 Encounter for screening for malignant neoplasm of cervix: Secondary | ICD-10-CM

## 2020-05-09 DIAGNOSIS — Z30011 Encounter for initial prescription of contraceptive pills: Secondary | ICD-10-CM

## 2020-05-09 DIAGNOSIS — A599 Trichomoniasis, unspecified: Secondary | ICD-10-CM

## 2020-05-09 DIAGNOSIS — N750 Cyst of Bartholin's gland: Secondary | ICD-10-CM | POA: Insufficient documentation

## 2020-05-09 MED ORDER — LO LOESTRIN FE 1 MG-10 MCG / 10 MCG PO TABS: 1.0000 | ORAL_TABLET | Freq: Every day | ORAL | 3 refills | Status: DC

## 2020-05-09 NOTE — Progress Notes (Signed)
Obstetrics and Gynecology New Patient Evaluation  Appointment Date: 05/09/2020  OBGYN Clinic: [facility]  Primary Care Provider: Patient, No Pcp Per  Referring Provider: Self  Chief Complaint: left labial bump  History of Present Illness: Beth Giles is a 34 y.o. African-American [id number] (Patient's last menstrual period was 04/16/2020.), seen for the above chief complaint. Last Thursday she woke up with GU pain and feeling a left labial bump; no prior s/s to this in the past. She went to UC and then was advised to go to the ED but left due to the long wait. She was already on flagyl for presumed BV that was called in by a nurse line. She did warm soaks at home and the s/s eventually went away although she does feel a smaller bump in that area.   Review of Systems: Pertinent items noted in HPI and remainder of comprehensive ROS otherwise negative.   Patient Active Problem List   Diagnosis Date Noted  . Cyst of left Bartholin's gland 05/09/2020  . Atypical squamous cell changes of undetermined significance (ASCUS) on cervical cytology with positive high risk human papilloma virus (HPV) 06/21/2017  . S/P C-section 05/19/2017  . Short cervical length during pregnancy in second trimester 12/26/2016  . Supervision of high-risk pregnancy 12/12/2016  . History of preterm delivery, currently pregnant 12/12/2016  . Tobacco smoking affecting pregnancy in first trimester 12/12/2016  . Anxiety during pregnancy, antepartum 12/12/2016    Past Medical History:  Past Medical History:  Diagnosis Date  . Anxiety   . Heart murmur    never has caused any problems  . Termination of pregnancy (fetus)    x1    Past Surgical History:  Past Surgical History:  Procedure Laterality Date  . CERVICAL CERCLAGE N/A 12/31/2016   Procedure: CERCLAGE CERVICAL;  Surgeon: Adam Phenix, MD;  Location: [facility];  Service: Gynecology;  Laterality: N/A;  . CESAREAN  SECTION N/A 05/19/2017   Procedure: CESAREAN SECTION;  Surgeon: Catalina Antigua, MD;  Location: [facility];  Service: Obstetrics;  Laterality: N/A;  . WISDOM TOOTH EXTRACTION      Past Obstetrical History:  OB History  Gravida Para Term Preterm AB Living  3 2 1 1 1 2   SAB TAB Ectopic Multiple Live Births  0 1 0 0 2    # Outcome Date GA Lbr Len/2nd Weight Sex Delivery Anes PTL Lv  3 Term 05/19/17 [redacted]w[redacted]d  5 lb 8.7 oz (2.515 kg) F CS-LTranv Spinal  LIV  2 Preterm 10/25/09 [redacted]w[redacted]d  2 lb 7 oz (1.106 kg) M Vag-Spont  N LIV  1 TAB            Past Gynecological History: As per HPI. Periods: qmonth, regular, approx one week History of Pap Smear(s): Yes.   Last pap 2018, which was negative She is currently using no method for contraception.   Social History:  Social History   Socioeconomic History  . Marital status: Single    Spouse name: Not on file  . Number of children: Not on file  . Years of education: Not on file  . Highest education level: Not on file  Occupational History  . Not on file  Tobacco Use  . Smoking status: Former Smoker    Packs/day: 0.20    Years: 15.00    Pack years: 3.00    Types: Cigarettes    Quit date: 12/14/2016    Years since quitting: 3.4  . Smokeless tobacco:  Never Used  Substance and Sexual Activity  . Alcohol use: No  . Drug use: No  . Sexual activity: Never    Birth control/protection: None    Comment: patient wants bc pills  Other Topics Concern  . Not on file  Social History Narrative  . Not on file   Social Determinants of Health   Financial Resource Strain:   . Difficulty of Paying Living Expenses:   Food Insecurity: No Food Insecurity  . Worried About Programme researcher, broadcasting/film/video in the Last Year: Never true  . Ran Out of Food in the Last Year: Never true  Transportation Needs: No Transportation Needs  . Lack of Transportation (Medical): No  . Lack of Transportation (Non-Medical): No  Physical Activity:   . Days of Exercise per  Week:   . Minutes of Exercise per Session:   Stress:   . Feeling of Stress :   Social Connections:   . Frequency of Communication with Friends and Family:   . Frequency of Social Gatherings with Friends and Family:   . Attends Religious Services:   . Active Member of Clubs or Organizations:   . Attends Banker Meetings:   Marland Kitchen Marital Status:   Intimate Partner Violence:   . Fear of Current or Ex-Partner:   . Emotionally Abused:   Marland Kitchen Physically Abused:   . Sexually Abused:     Family History:  Family History  Problem Relation Age of Onset  . Hypertension Mother   . Diabetes Mother   . Hypertension Father   . Hypertension Sister     Medications None  Allergies Penicillins cross reactors and Hydrocodone   Physical Exam:  BP 137/87   Pulse 98   Ht 5\' 2"  (1.575 m)   Wt 145 lb 4.8 oz (65.9 kg)   LMP 04/16/2020   BMI 26.58 kg/m  Body mass index is 26.58 kg/m. General appearance: Well nourished, well developed female in no acute distress.  Respiratory:  Normal respiratory effort Abdomen: positive bowel sounds and no masses, hernias; diffusely non tender to palpation, non distended Neuro/Psych:  Normal mood and affect.  Skin:  Warm and dry.  Lymphatic:  No inguinal lymphadenopathy.   Pelvic exam: is not limited by body habitus EGBUS: 1.5cm area of induration at the left bartholin's gland, mobile, nttp, normal overllying skin.  Vagina: within normal limits and with no blood or discharge in the vault Cervix: normal appearing cervix without tenderness, discharge or lesions. Uterus:  nonenlarged and non tender Adnexa:  normal adnexa and no mass, fullness, tenderness Rectovaginal: deferred  Laboratory: none  Radiology: none  Assessment: pt doing well  Plan: 1. Cervical cancer screening - Cytology - PAP( [place])  2. Cyst of left Bartholin's gland D/w her if s/s recur to call for office visit same day or next for possible word catheter and to do  behavioral measures like she did before in the interim.   3. Encounter for initial prescription of contraceptive pills Options d/w her. She has no contraindications to estrogen therapy and would like to try pills again. Lo loestrin sent for Sunday start.   RTC PRN  Tuesday MD Attending [facility] [employer])

## 2020-05-10 ENCOUNTER — Encounter: Payer: Self-pay | Admitting: Family Medicine

## 2020-05-11 LAB — CYTOLOGY - PAP
Chlamydia: NEGATIVE
Comment: NEGATIVE
Comment: NEGATIVE
Comment: NEGATIVE
Comment: NORMAL
Diagnosis: NEGATIVE
High risk HPV: NEGATIVE
Neisseria Gonorrhea: NEGATIVE
Trichomonas: POSITIVE — AB

## 2020-05-12 DIAGNOSIS — A599 Trichomoniasis, unspecified: Secondary | ICD-10-CM | POA: Insufficient documentation

## 2020-05-12 MED ORDER — METRONIDAZOLE 500 MG PO TABS: ORAL_TABLET | ORAL | 0 refills | Status: DC

## 2020-05-12 NOTE — Addendum Note (Signed)
Addended by: [place] Bing on: 05/12/2020 12:08 PM   Modules accepted: Orders

## 2020-05-13 ENCOUNTER — Other Ambulatory Visit: Payer: Self-pay | Admitting: Obstetrics and Gynecology

## 2020-05-23 ENCOUNTER — Other Ambulatory Visit: Payer: Self-pay

## 2020-05-23 ENCOUNTER — Encounter: Payer: Self-pay | Admitting: Family Medicine

## 2020-05-23 ENCOUNTER — Ambulatory Visit (INDEPENDENT_AMBULATORY_CARE_PROVIDER_SITE_OTHER): Payer: Managed Care, Other (non HMO) | Admitting: Family Medicine

## 2020-05-23 VITALS — BP 129/83 | Ht 62.0 in | Wt 145.0 lb

## 2020-05-23 DIAGNOSIS — N750 Cyst of Bartholin's gland: Secondary | ICD-10-CM | POA: Diagnosis not present

## 2020-05-23 NOTE — Progress Notes (Signed)
  Bartholin Cyst I&D and Word Catheter Placement Enlarged abscess palpated in front of the hymenal ring around 5 o' clock.  Written informed consent was obtained.  Discussed complications and possible outcomes of procedure including recurrence of cyst, scarring leading to infecton, bleeding, dyspareunia, distortion of anatomy.  Patient was examined in the dorsal lithotomy position and mass was identified.  The area was prepped with Iodine and draped in a sterile manner. 1% Lidocaine (3 ml) was then used to infiltrate area on top of the cyst, behind the hymenal ring.  A 7 mm incision was made using a sterile scapel. Upon palpation of the mass, a moderate amount of bloody purulent drainage was expressed through the incision. A hemostat was used to break up loculations, which resulted in expression of more bloody purulent drainage.  Samples of the drainage were sent for cultures. A Word catheter was placed. 2 ml of sterile water was used to inflate the catheter balloon.  The end of the catheter was tucked into the vagina.  Patient tolerated the procedure well.  - Recommended Sitz baths bid and Motrin and Percocet was given  prn pain.   She was told to call to be examined if she experiences increasing swelling, pain, vaginal discharge, or fever.  - She was instructed to wear a peripad to absorb discharge, and to maintain pelvic rest while the Word catheter is in place.  -The catheter will be left in place for at least four weeks to promote formation of an epithelialized tract for permanent drainage of glandular secretions.

## 2020-05-23 NOTE — Patient Instructions (Addendum)
For Bartholin's Cyst: Use ibuprofen 600mg  every 6 hours Wear a pad to absorb discharge Sitz baths twice a day. Return in 2-4 weeks for removal   [facility] [address] [facility]. [address] [place], [place] [phone]

## 2020-06-27 ENCOUNTER — Ambulatory Visit: Payer: Managed Care, Other (non HMO) | Admitting: Family Medicine

## 2022-10-23 ENCOUNTER — Other Ambulatory Visit: Payer: Self-pay

## 2022-10-23 ENCOUNTER — Emergency Department (HOSPITAL_BASED_OUTPATIENT_CLINIC_OR_DEPARTMENT_OTHER): Payer: Managed Care, Other (non HMO)

## 2022-10-23 DIAGNOSIS — M79605 Pain in left leg: Secondary | ICD-10-CM | POA: Insufficient documentation

## 2022-10-23 DIAGNOSIS — Z5321 Procedure and treatment not carried out due to patient leaving prior to being seen by health care provider: Secondary | ICD-10-CM | POA: Diagnosis not present

## 2022-10-23 LAB — URINALYSIS, ROUTINE W REFLEX MICROSCOPIC
Bilirubin Urine: NEGATIVE
Glucose, UA: NEGATIVE mg/dL
Hgb urine dipstick: NEGATIVE
Ketones, ur: NEGATIVE mg/dL
Leukocytes,Ua: NEGATIVE
Nitrite: NEGATIVE
Protein, ur: NEGATIVE mg/dL
Specific Gravity, Urine: 1.022 (ref 1.005–1.030)
pH: 6.5 (ref 5.0–8.0)

## 2022-10-23 NOTE — ED Triage Notes (Signed)
POV from home, NAD, A&O x 4, GCS 15.  Pt c/o left leg pain that starts in anterior groin area and radiates down to knee and sometimes becomes numb, worse at night or after sitting for a while then getting up to do things.

## 2022-10-23 NOTE — ED Triage Notes (Signed)
Also wants UA for possible UTI that she doesn't feel like resolved after completing abx. Sts darker urine recently.

## 2022-10-24 ENCOUNTER — Emergency Department (HOSPITAL_BASED_OUTPATIENT_CLINIC_OR_DEPARTMENT_OTHER)
Admission: EM | Admit: 2022-10-24 | Discharge: 2022-10-24 | Discharge status: Against Medical Advice | Payer: Managed Care, Other (non HMO) | Attending: Emergency Medicine | Admitting: Emergency Medicine
